# Patient Record
Sex: Female | Born: 1975
Health system: Southern US, Community
[De-identification: ages and names within clinical notes are randomized; demographics above are authoritative.]

## PROBLEM LIST (undated history)

## (undated) DIAGNOSIS — K649 Unspecified hemorrhoids: Secondary | ICD-10-CM

## (undated) HISTORY — PX: GALLBLADDER SURGERY: SHX652

## (undated) HISTORY — DX: Unspecified hemorrhoids: K64.9

## (undated) HISTORY — PX: DILATION AND CURETTAGE OF UTERUS: SHX78

## (undated) HISTORY — PX: HERNIA REPAIR: SHX51

## (undated) HISTORY — PX: APPENDECTOMY: SHX54

---

## 2006-03-04 HISTORY — PX: CYSTOSCOPY WITH URETHRAL DILATATION: SHX5125

## 2007-12-03 HISTORY — PX: UMBILICAL HERNIA REPAIR: SHX196

## 2011-11-02 ENCOUNTER — Other Ambulatory Visit: Payer: Self-pay

## 2011-11-02 LAB — CBC WITH DIFFERENTIAL/PLATELET
Basophil %: 0.7 %
Eosinophil %: 2.5 %
HCT: 40.9 % (ref 35.0–47.0)
HGB: 14.2 g/dL (ref 12.0–16.0)
Lymphocyte #: 1.5 10*3/uL (ref 1.0–3.6)
Lymphocyte %: 25.6 %
MCH: 32 pg (ref 26.0–34.0)
MCV: 92 fL (ref 80–100)
Monocyte #: 0.5 x10 3/mm (ref 0.2–0.9)
Neutrophil #: 3.7 10*3/uL (ref 1.4–6.5)
RBC: 4.44 10*6/uL (ref 3.80–5.20)
WBC: 5.9 10*3/uL (ref 3.6–11.0)

## 2011-11-02 LAB — LIPID PANEL
Cholesterol: 208 mg/dL — ABNORMAL HIGH (ref 0–200)
HDL Cholesterol: 92 mg/dL — ABNORMAL HIGH (ref 40–60)
Ldl Cholesterol, Calc: 106 mg/dL — ABNORMAL HIGH (ref 0–100)
Triglycerides: 48 mg/dL (ref 0–200)
VLDL Cholesterol, Calc: 10 mg/dL (ref 5–40)

## 2011-11-02 LAB — GLUCOSE, RANDOM: Glucose: 95 mg/dL (ref 65–99)

## 2014-08-03 ENCOUNTER — Telehealth: Payer: Self-pay

## 2014-08-03 ENCOUNTER — Telehealth: Payer: Self-pay | Admitting: Gastroenterology

## 2014-08-04 ENCOUNTER — Ambulatory Visit: Payer: Self-pay | Admitting: Gastroenterology

## 2014-08-04 ENCOUNTER — Encounter: Payer: Self-pay | Admitting: Gastroenterology

## 2014-08-04 ENCOUNTER — Ambulatory Visit (INDEPENDENT_AMBULATORY_CARE_PROVIDER_SITE_OTHER): Payer: 59 | Admitting: Gastroenterology

## 2014-08-04 VITALS — BP 120/81 | HR 56 | Temp 98.2°F | Ht 67.0 in | Wt 204.0 lb

## 2014-08-04 DIAGNOSIS — R1013 Epigastric pain: Secondary | ICD-10-CM | POA: Diagnosis not present

## 2014-08-04 DIAGNOSIS — K5900 Constipation, unspecified: Secondary | ICD-10-CM | POA: Diagnosis not present

## 2014-08-04 DIAGNOSIS — R140 Abdominal distension (gaseous): Secondary | ICD-10-CM | POA: Insufficient documentation

## 2014-08-04 NOTE — Progress Notes (Signed)
Primary Care Physician:  Enid Derry, MD  Primary Gastroenterologist:  Dr. Lucilla Lame  Chief Complaint  Patient presents with  . Abdominal Pain  . Constipation    HPI:  Sheryl Morrow is a 39 y.o. female here for abdominal pain in the epigastric area with constipation.  The patient reports that she had a colonoscopy 1 year ago in [place] for rectal bleeding which showed hemorrhoids.  She reports that shortly after eating she gets bloated she also reports that she is constipated.  Sometimes she will have 2 bowel movements a day and then would not have bowel movements for a few days.  There is no report of any unexplained weight loss in fact the patient reports that she has gained 15 pounds over the last year.  She was seen by her primary care provider who sent off blood tests for possible other causes including thyroid.  The patient reports this to have been negative.  The patient reports that she will have epigastric pain with bloating shortly after eating and states that it resulted in her not feeling well for a good part of the day after eating.  Patient denies any nausea vomiting fevers or chills. The patient has recently been start magnesium citrate and MiraLAX she has only taken it for one day.  Current Outpatient Prescriptions  Medication Sig Dispense Refill  . Omega-3 Fatty Acids (FISH OIL) 1000 MG CAPS Take by mouth.     No current facility-administered medications for this visit.    Allergies as of 08/04/2014  . (No Known Allergies)    Past Medical History  Diagnosis Date  . Hemorrhoids     Past Surgical History  Procedure Laterality Date  . Cystoscopy with urethral dilatation  2008  . Umbilical hernia repair  12/2007  . Cesarean section  09/2008    Family History  Problem Relation Age of Onset  . Heart disease Father     Bypass  . Diabetes Paternal Grandmother     History   Social History  . Marital Status: Married    Spouse Name: N/A  . Number of  Children: N/A  . Years of Education: N/A   Occupational History  . Not on file.   Social History Main Topics  . Smoking status: Never Smoker   . Smokeless tobacco: Not on file  . Alcohol Use: 0.0 oz/week    0 Standard drinks or equivalent per week     Comment: Moderate use  . Drug Use: No  . Sexual Activity: Not on file   Other Topics Concern  . Not on file   Social History Narrative      ROS:  General: Negative for anorexia, weight loss, fever, chills, fatigue, weakness. Eyes: Negative for vision changes.  ENT: Negative for hoarseness, difficulty swallowing , nasal congestion. CV: Negative for chest pain, angina, palpitations, dyspnea on exertion, peripheral edema.  Respiratory: Negative for dyspnea at rest, dyspnea on exertion, cough, sputum, wheezing.  GI: See history of present illness. GU:  Negative for dysuria, hematuria, urinary incontinence, urinary frequency, nocturnal urination.  MS: Negative for joint pain, low back pain.  Derm: Negative for rash or itching.  Neuro: Negative for weakness, abnormal sensation, seizure, frequent headaches, memory loss, confusion.  Psych: Negative for anxiety, depression, suicidal ideation, hallucinations.  Endo: Negative for unusual weight change.  Heme: Negative for bruising or bleeding. Allergy: Negative for rash or hives.    Physical Examination:  BP 120/81 mmHg  Pulse 56  Temp(Src) 98.2 F (  36.8 C) (Oral)  Ht 5' 7"  (1.702 m)  Wt 204 lb (92.534 kg)  BMI 31.94 kg/m2   General: Well-nourished, well-developed in no acute distress.  Head: Normocephalic, atraumatic.   Eyes: Conjunctiva pink, no icterus. EOMI. Mouth: Oropharyngeal mucosa moist and pink, no lesions erythema or exudate. Neck: Supple without thyromegaly, masses, or lymphadenopathy.  Lungs: Clear to auscultation bilaterally.  Heart: Regular rate and rhythm, no murmurs rubs or gallops.  Abdomen: Bowel sounds are normal, soft, nontender, nondistended, no  hepatosplenomegaly or masses, no abdominal bruits or hernia, no rebound tenderness or guarding.  Rectal: Deferred. Extremities: No lower extremity edema. No clubbing or deformities.  Neuro: Alert and oriented x 3, grossly normal neurologically.  Skin: Warm and dry, no rash or jaundice.   Psych: Cooperative, normal mood and affect.  Imaging Studies: No results found.

## 2014-08-04 NOTE — Assessment & Plan Note (Signed)
This patient is a 39 year old woman constipation and weight gain.  The patient has been having these problems for the last 4 months and will be set up for a right upper quadrant ultrasound due to dyspepsia when she eats.  She also has bloating.  The patient will also have her blood sent for H. pylori.  The patient does not need a repeat colonoscopy since she had one a year ago and I do not believe an upper endoscopy is needed at the present time.  The patient has been explained the plan and agrees with it.

## 2014-08-04 NOTE — Addendum Note (Signed)
Addended byGlennie Isle E on: 08/04/2014 12:25 PM   Modules accepted: Orders

## 2014-08-04 NOTE — Telephone Encounter (Signed)
Opened encounter in error  

## 2014-08-05 NOTE — Telephone Encounter (Signed)
Pt was seen by Dr Allen Norris on 08/04/14.

## 2014-08-06 LAB — H PYLORI, IGM, IGG, IGA AB: H. pylori, IgA Abs: <9 units (ref 0.0–8.9)

## 2014-08-08 NOTE — Progress Notes (Signed)
Noted, thank you for caring for patient

## 2014-08-17 ENCOUNTER — Telehealth: Payer: Self-pay

## 2014-08-17 NOTE — Telephone Encounter (Signed)
Pt notified of results. Pt stated she was feeling better. Will let us know if anything changes.

## 2014-08-17 NOTE — Telephone Encounter (Signed)
-----   Message from Lucilla Lame, MD sent at 08/05/2014  1:53 PM EDT ----- Sheryl Morrow, Let the patient know that the blood test for H. pylori was negative.

## 2014-09-07 ENCOUNTER — Telehealth: Payer: Self-pay

## 2014-09-07 NOTE — Telephone Encounter (Signed)
-----   Message from Lucilla Lame, MD sent at 08/05/2014 11:29 AM EDT ----- This patient never went for the U/S

## 2014-09-07 NOTE — Telephone Encounter (Signed)
Pt feeling better. Will call back if anything changes.

## 2015-07-11 DIAGNOSIS — D1801 Hemangioma of skin and subcutaneous tissue: Secondary | ICD-10-CM | POA: Diagnosis not present

## 2015-07-24 ENCOUNTER — Ambulatory Visit (INDEPENDENT_AMBULATORY_CARE_PROVIDER_SITE_OTHER): Payer: 59 | Admitting: Family Medicine

## 2015-07-24 ENCOUNTER — Encounter: Payer: Self-pay | Admitting: Family Medicine

## 2015-07-24 VITALS — BP 120/76 | HR 96 | Temp 97.8°F | Ht 67.0 in | Wt 207.4 lb

## 2015-07-24 DIAGNOSIS — E669 Obesity, unspecified: Secondary | ICD-10-CM | POA: Diagnosis not present

## 2015-07-24 DIAGNOSIS — Z1239 Encounter for other screening for malignant neoplasm of breast: Secondary | ICD-10-CM

## 2015-07-24 DIAGNOSIS — Z130 Encounter for screening for diseases of the blood and blood-forming organs and certain disorders involving the immune mechanism: Secondary | ICD-10-CM

## 2015-07-24 DIAGNOSIS — Z0000 Encounter for general adult medical examination without abnormal findings: Secondary | ICD-10-CM | POA: Insufficient documentation

## 2015-07-24 DIAGNOSIS — Z205 Contact with and (suspected) exposure to viral hepatitis: Secondary | ICD-10-CM

## 2015-07-24 LAB — CBC
HEMATOCRIT: 40 % (ref 36.0–46.0)
Hemoglobin: 13.6 g/dL (ref 12.0–15.0)
MCHC: 33.9 g/dL (ref 30.0–36.0)
MCV: 90.4 fl (ref 78.0–100.0)
PLATELETS: 203 10*3/uL (ref 150.0–400.0)
RBC: 4.42 Mil/uL (ref 3.87–5.11)
RDW: 12.9 % (ref 11.5–15.5)
WBC: 7.2 10*3/uL (ref 4.0–10.5)

## 2015-07-24 LAB — COMPREHENSIVE METABOLIC PANEL
ALBUMIN: 4.5 g/dL (ref 3.5–5.2)
ALK PHOS: 41 U/L (ref 39–117)
ALT: 20 U/L (ref 0–35)
AST: 26 U/L (ref 0–37)
BUN: 16 mg/dL (ref 6–23)
CALCIUM: 9.7 mg/dL (ref 8.4–10.5)
CO2: 30 mEq/L (ref 19–32)
Chloride: 100 mEq/L (ref 96–112)
Creatinine, Ser: 0.9 mg/dL (ref 0.40–1.20)
GFR: 73.38 mL/min (ref >60.00)
Glucose, Bld: 88 mg/dL (ref 70–99)
POTASSIUM: 4 meq/L (ref 3.5–5.1)
Sodium: 136 mEq/L (ref 135–145)
TOTAL PROTEIN: 7 g/dL (ref 6.0–8.3)
Total Bilirubin: 0.6 mg/dL (ref 0.2–1.2)

## 2015-07-24 LAB — LIPID PANEL
CHOL/HDL RATIO: 3
CHOLESTEROL: 230 mg/dL — AB (ref 0–200)
HDL: 75.4 mg/dL (ref >39.00)
LDL Cholesterol: 142 mg/dL — ABNORMAL HIGH (ref 0–99)
NonHDL: 154.54
Triglycerides: 64 mg/dL (ref 0.0–149.0)
VLDL: 12.8 mg/dL (ref 0.0–40.0)

## 2015-07-24 LAB — LDL CHOLESTEROL, DIRECT: LDL DIRECT: 127 mg/dL

## 2015-07-24 LAB — HEMOGLOBIN A1C: HEMOGLOBIN A1C: 5.5 % (ref 4.6–6.5)

## 2015-07-24 MED ORDER — CLONAZEPAM 0.5 MG PO TABS: 0.5000 mg | ORAL_TABLET | Freq: Two times a day (BID) | ORAL | Status: DC | PRN

## 2015-07-24 NOTE — Progress Notes (Signed)
Pre visit review using our clinic review tool, if applicable. No additional management support is needed unless otherwise documented below in the visit note. 

## 2015-07-24 NOTE — Progress Notes (Addendum)
Subjective:  Patient ID: Sheryl Morrow, female    DOB: 1976/02/09  Age: 40 y.o. MRN: [id number]  CC: Establish care  HPI Sheryl Morrow is a 40 y.o. female presents to the clinic today to establish care.  Preventative Healthcare  Pap smear: In need of (last pap smear was in May 2014).  Mammogram: Will discuss screening today.  Colonoscopy: Had colonoscopy in 2014.  Immunizations  Tetanus - up-to-date.  Pneumococcal - not indicated.  Flu - not indicated at this time.  Zoster - not indicated at this time.  Hepatitis C screening - patient's husband has hepatitis C. She desires screening.  Labs: Screening labs today.  Exercise: Reports regular exercise.   Alcohol use: See below.  Smoking/tobacco use: Former smoker.  Wears seat belt: Yes.  PMH, Surgical Hx, Family Hx, Social History reviewed and updated as below.  Past Medical History:  Diagnosis Date  . Hemorrhoids    Past Surgical History:  Procedure Laterality Date  . APPENDECTOMY    . BREAST SURGERY     breast biopsy  . CESAREAN SECTION  09/2008  . CYSTOSCOPY WITH URETHRAL DILATATION  2008  . DILATION AND CURETTAGE OF UTERUS     twice in 2007  . GALLBLADDER SURGERY     2007  . HERNIA REPAIR     embilical  . UMBILICAL HERNIA REPAIR  12/2007   Family History  Problem Relation Age of Onset  . Heart disease Father     Bypass  . Hypertension Father   . Diabetes Paternal Grandmother    Social History  Substance Use Topics  . Smoking status: Never Smoker  . Smokeless tobacco: Not on file  . Alcohol use 0.0 oz/week     Comment: Moderate use   Review of Systems General: Denies unexplained weight loss, fever. Skin: Denies new or changing mole, sore/wound that won't heal. ENT: Trouble hearing, ringing in the ears, sores in the mouth, hoarseness, trouble swallowing. Eyes: Denies trouble seeing/visual disturbance. Heart/CV: Denies chest pain, shortness of breath, edema, palpitations. Lungs/Resp:  Denies cough, shortness of breath, hemoptysis. Abd/GI: Denies nausea, vomiting, diarrhea, constipation, abdominal pain, hematochezia, melena. GU: Denies dysuria, incontinence, hematuria, urinary frequency, difficulty starting/keeping stream, vaginal discharge, sexual difficulty, lump in breasts. MSK: Denies joint pain/swelling, myalgias. Neuro: Denies headaches, weakness, numbness, dizziness, syncope. Psych: Denies sadness, anxiety, stress, memory difficulty. Endocrine: Denies polyuria and polydipsia.  Objective:   Today's Vitals: BP 120/76   Pulse 96   Temp 97.8 F (36.6 C) (Oral)   Ht _0  (1.702 m)   Wt 207 lb 6.4 oz (94.1 kg)   LMP 07/10/2015   SpO2 96%   BMI 32.48 kg/m   Physical Exam  Constitutional: She is oriented to person, place, and time. She appears well-developed and well-nourished. No distress.  HENT:  Head: Normocephalic and atraumatic.  Nose: Nose normal.  Mouth/Throat: Oropharynx is clear and moist. No oropharyngeal exudate.  Normal TM's bilaterally.   Eyes: Conjunctivae are normal. No scleral icterus.  Neck: Neck supple. No thyromegaly present.  Cardiovascular: Normal rate and regular rhythm.   No murmur heard. Pulmonary/Chest: Effort normal and breath sounds normal. She has no wheezes. She has no rales.  Abdominal: Soft. She exhibits no distension. There is no tenderness. There is no rebound and no guarding.  Musculoskeletal: Normal range of motion. She exhibits no edema.  Lymphadenopathy:    She has no cervical adenopathy.  Neurological: She is alert and oriented to person, place, and time.  Skin: Skin is  warm and dry. No rash noted.  Psychiatric: She has a normal mood and affect.  Vitals reviewed. Breasts: breasts appear normal, no appreciable masses, no skin or nipple changes or axillary nodes.  Assessment & Plan:   Problem List Items Addressed This Visit    Preventative health care - Primary    Pap smear later this year. Immunizations up to  date. Screening labs today. Breast exam performed. Mammogram scheduled. Follow up later this year for mammogram.       Other Visit Diagnoses    Screening for deficiency anemia       Relevant Orders   CBC (Completed)   Obesity (BMI 30.0-34.9)       Relevant Orders   Comp Met (CMET) (Completed)   Lipid Profile (Completed)   Direct LDL (Completed)   HgB A1c (Completed)   Exposure to hepatitis C       Relevant Orders   Hepatitis C Antibody (Completed)   Screening for breast cancer          Outpatient Encounter Prescriptions as of 07/24/2015  Medication Sig  . Omega-3 Fatty Acids (FISH OIL) 1000 MG CAPS Take by mouth.  . [DISCONTINUED] clonazePAM (KLONOPIN) 0.5 MG tablet Take 0.5 mg by mouth 2 (two) times daily as needed for anxiety.  . [DISCONTINUED] clonazePAM (KLONOPIN) 0.5 MG tablet Take 1 tablet (0.5 mg total) by mouth 2 (two) times daily as needed for anxiety.   No facility-administered encounter medications on file as of 07/24/2015.     Follow-up: Return in about 6 months (around 01/24/2016).  [place]

## 2015-07-24 NOTE — Assessment & Plan Note (Signed)
Pap smear later this year. Immunizations up to date. Screening labs today. Breast exam performed. Mammogram scheduled. Follow up later this year for mammogram.

## 2015-07-24 NOTE — Patient Instructions (Addendum)
It was nice to see you today.  Follow up later this year for your pap smear.  We will call with your lab results.  Regarding your husband, take a look at [facility] (have him seen by a physician).  Follow up:  Return in about 6 months (around 01/24/2016).  Take care  Dr. Lacinda Axon

## 2015-07-25 LAB — HEPATITIS C ANTIBODY: HCV AB: NEGATIVE

## 2015-08-04 ENCOUNTER — Ambulatory Visit: Payer: 59 | Attending: Family Medicine

## 2015-08-22 ENCOUNTER — Ambulatory Visit
Admission: RE | Admit: 2015-08-22 | Discharge: 2015-08-22 | Disposition: A | Discharge status: Home / Self Care | Payer: 59 | Source: Ambulatory Visit | Attending: Family Medicine | Admitting: Family Medicine

## 2015-08-22 ENCOUNTER — Other Ambulatory Visit: Payer: Self-pay | Admitting: Family Medicine

## 2015-08-22 DIAGNOSIS — Z1239 Encounter for other screening for malignant neoplasm of breast: Secondary | ICD-10-CM

## 2015-08-22 DIAGNOSIS — Z1231 Encounter for screening mammogram for malignant neoplasm of breast: Secondary | ICD-10-CM | POA: Diagnosis not present

## 2015-10-05 ENCOUNTER — Other Ambulatory Visit: Payer: Self-pay | Admitting: Family Medicine

## 2015-10-05 NOTE — Telephone Encounter (Signed)
Medication was refilled 07/2015. Patient last seen 07/24/15. Please advise?

## 2015-10-06 NOTE — Telephone Encounter (Signed)
Faxed

## 2015-12-11 ENCOUNTER — Other Ambulatory Visit: Payer: Self-pay | Admitting: Family Medicine

## 2015-12-12 NOTE — Telephone Encounter (Signed)
faxed

## 2015-12-12 NOTE — Telephone Encounter (Signed)
Refilled 10/2015. Pt last see on 07/24/15. Please advise?

## 2016-01-17 ENCOUNTER — Other Ambulatory Visit: Payer: Self-pay | Admitting: Family Medicine

## 2016-01-17 NOTE — Telephone Encounter (Signed)
Faxed

## 2016-01-17 NOTE — Telephone Encounter (Signed)
Refilled 12/12/15. Pt last seen 07/24/15. Please advise?

## 2016-01-29 ENCOUNTER — Ambulatory Visit: Payer: 59 | Admitting: Family Medicine

## 2016-02-08 ENCOUNTER — Ambulatory Visit (INDEPENDENT_AMBULATORY_CARE_PROVIDER_SITE_OTHER): Payer: 59 | Admitting: Family Medicine

## 2016-02-08 ENCOUNTER — Encounter: Payer: Self-pay | Admitting: Family Medicine

## 2016-02-08 ENCOUNTER — Other Ambulatory Visit (HOSPITAL_COMMUNITY)
Admission: RE | Admit: 2016-02-08 | Discharge: 2016-02-08 | Disposition: A | Discharge status: Home / Self Care | Payer: 59 | Source: Ambulatory Visit | Attending: Family Medicine | Admitting: Family Medicine

## 2016-02-08 VITALS — BP 117/77 | HR 95 | Temp 97.6°F | Resp 12 | Wt 201.5 lb

## 2016-02-08 DIAGNOSIS — Z124 Encounter for screening for malignant neoplasm of cervix: Secondary | ICD-10-CM

## 2016-02-08 DIAGNOSIS — Z01419 Encounter for gynecological examination (general) (routine) without abnormal findings: Secondary | ICD-10-CM | POA: Diagnosis present

## 2016-02-08 DIAGNOSIS — Z1151 Encounter for screening for human papillomavirus (HPV): Secondary | ICD-10-CM | POA: Insufficient documentation

## 2016-02-08 DIAGNOSIS — Z0000 Encounter for general adult medical examination without abnormal findings: Secondary | ICD-10-CM | POA: Insufficient documentation

## 2016-02-08 DIAGNOSIS — J029 Acute pharyngitis, unspecified: Secondary | ICD-10-CM | POA: Diagnosis not present

## 2016-02-08 NOTE — Progress Notes (Signed)
   Subjective:  Patient ID: Sheryl Morrow, female    DOB: 10-19-1975  Age: 40 y.o. MRN: [id number]  CC: Pap smear, sore throat  HPI:  40 year old female presents for Pap smear. She also has complaints of sore throat.  Patient's last past year was in 2014. She is in need of Pap smear today.  Additionally, patient has had a sore throat for the past 4 days. No associated fevers or chills. She reports left-sided neck pain as well. No known exacerbating or relieving factors. She states that she feels well otherwise. No other concerns at this time.  Social Hx   Social History   Social History  . Marital status: Married    Spouse name: N/A  . Number of children: N/A  . Years of education: N/A   Social History Main Topics  . Smoking status: Never Smoker  . Smokeless tobacco: None  . Alcohol use 0.0 oz/week     Comment: Moderate use  . Drug use: No  . Sexual activity: Not Asked   Other Topics Concern  . None   Social History Narrative  . None    Review of Systems  Constitutional: Negative.   HENT: Positive for sore throat.   Genitourinary: Negative.    Objective:  BP 117/77 (BP Location: Left Arm, Patient Position: Sitting, Cuff Size: Normal)   Pulse 95   Temp 97.6 F (36.4 C) (Oral)   Resp 12   Wt 201 lb 8 oz (91.4 kg)   SpO2 99%   BMI 31.56 kg/m   BP/Weight 02/08/2016 7/94/8016 07/07/3746  Systolic BP 270 786 754  Diastolic BP 77 76 81  Wt. (Lbs) 201.5 207.4 204  BMI 31.56 32.48 31.94   Physical Exam  Constitutional: She is oriented to person, place, and time. She appears well-developed. No distress.  HENT:  Head: Normocephalic and atraumatic.  Mild oropharyngeal erythema.  Neck: Neck supple.  Pulmonary/Chest: Effort normal.  Genitourinary:  Genitourinary Comments: Pelvic Exam: External: normal female genitalia without lesions or masses Vagina: normal without lesions or masses Cervix: normal without lesions or masses. Pap smear: performed     Lymphadenopathy:    She has no cervical adenopathy.  Neurological: She is alert and oriented to person, place, and time.  Psychiatric: She has a normal mood and affect.  Vitals reviewed.  Lab Results  Component Value Date   WBC 7.2 07/24/2015   HGB 13.6 07/24/2015   HCT 40.0 07/24/2015   PLT 203.0 07/24/2015   GLUCOSE 88 07/24/2015   CHOL 230 (H) 07/24/2015   TRIG 64.0 07/24/2015   HDL 75.40 07/24/2015   LDLDIRECT 127.0 07/24/2015   LDLCALC 142 (H) 07/24/2015   ALT 20 07/24/2015   AST 26 07/24/2015   NA 136 07/24/2015   K 4.0 07/24/2015   CL 100 07/24/2015   CREATININE 0.90 07/24/2015   BUN 16 07/24/2015   CO2 30 07/24/2015   HGBA1C 5.5 07/24/2015    Assessment & Plan:   Problem List Items Addressed This Visit    Viral pharyngitis    New problem (uncomplicated illness). Appears viral in origin. Advised supportive care. Over-the-counter medications as needed.      Screening for cervical cancer - Primary    Pap smear performed today.      Relevant Orders   Cytology - PAP     Follow-up: Annually  [place]

## 2016-02-08 NOTE — Assessment & Plan Note (Signed)
Pap smear performed today.

## 2016-02-08 NOTE — Assessment & Plan Note (Signed)
New problem (uncomplicated illness). Appears viral in origin. Advised supportive care. Over-the-counter medications as needed.

## 2016-02-08 NOTE — Patient Instructions (Addendum)
We will call with the results.   Follow up annually.  Take care  Dr. Lacinda Axon

## 2016-02-08 NOTE — Progress Notes (Signed)
Pre visit review using our clinic review tool, if applicable. No additional management support is needed unless otherwise documented below in the visit note. 

## 2016-02-14 LAB — CYTOLOGY - PAP
Diagnosis: NEGATIVE
HPV: NOT DETECTED

## 2016-03-20 ENCOUNTER — Other Ambulatory Visit: Payer: Self-pay | Admitting: Family Medicine

## 2016-03-22 NOTE — Telephone Encounter (Signed)
Refilled 01/17/16. Pt las seen 02/08/16. Please advise?

## 2016-03-22 NOTE — Telephone Encounter (Signed)
faxed

## 2016-04-17 ENCOUNTER — Other Ambulatory Visit: Payer: Self-pay | Admitting: Family Medicine

## 2016-04-17 NOTE — Telephone Encounter (Signed)
Refilled 03/22/16. Pt last seen 02/08/16. Please advise?

## 2016-04-18 NOTE — Telephone Encounter (Signed)
faxed

## 2016-05-07 ENCOUNTER — Telehealth: Payer: Self-pay | Admitting: Family

## 2016-05-07 NOTE — Telephone Encounter (Signed)
Pt wanted to switch from Dr Lacinda Axon to [place]. Pt wanted a female provider. Thank you!

## 2016-07-04 ENCOUNTER — Telehealth: Payer: Self-pay | Admitting: Family

## 2016-07-04 NOTE — Telephone Encounter (Signed)
Reason for call: bloated Symptoms: middle of stomach , after eating, feels better when standing worse when sitting, going out of town to [place] tomorrow ,some constipation, bowel movements dark in color, stomach making gurgling sounds, yesterday hurting bad in upper stomach ,  Didn't urinate as much yesterday Duration 5 days Medications: tried Pantoprazole  Last seen for this problem: 1 year ago, for bloatness Seen by: Dr Allen Norris Advised to go to urgent care to be evaluated for symptoms

## 2016-07-04 NOTE — Telephone Encounter (Signed)
Pt called c/o discomfort on top of stomach, feels bloated after eating. She also states that there are some gurgling sounds. She states that she has had this for about 5 days. Please advise, thank you!  Call pt @ [phone]

## 2016-07-18 ENCOUNTER — Other Ambulatory Visit: Payer: Self-pay | Admitting: Family Medicine

## 2016-07-24 ENCOUNTER — Encounter: Payer: 59 | Admitting: Family Medicine

## 2016-07-25 ENCOUNTER — Ambulatory Visit (INDEPENDENT_AMBULATORY_CARE_PROVIDER_SITE_OTHER): Payer: 59 | Admitting: Family

## 2016-07-25 ENCOUNTER — Encounter: Payer: Self-pay | Admitting: Family

## 2016-07-25 VITALS — BP 140/90 | HR 75 | Temp 97.7°F | Resp 18 | Ht 66.0 in | Wt 207.5 lb

## 2016-07-25 DIAGNOSIS — Z0000 Encounter for general adult medical examination without abnormal findings: Secondary | ICD-10-CM | POA: Diagnosis not present

## 2016-07-25 DIAGNOSIS — K219 Gastro-esophageal reflux disease without esophagitis: Secondary | ICD-10-CM

## 2016-07-25 DIAGNOSIS — F419 Anxiety disorder, unspecified: Secondary | ICD-10-CM | POA: Insufficient documentation

## 2016-07-25 DIAGNOSIS — K649 Unspecified hemorrhoids: Secondary | ICD-10-CM | POA: Insufficient documentation

## 2016-07-25 LAB — LIPID PANEL
CHOLESTEROL: 246 mg/dL — AB (ref 0–200)
HDL: 99.6 mg/dL (ref >39.00)
LDL Cholesterol: 137 mg/dL — ABNORMAL HIGH (ref 0–99)
NonHDL: 146.63
TRIGLYCERIDES: 48 mg/dL (ref 0.0–149.0)
Total CHOL/HDL Ratio: 2
VLDL: 9.6 mg/dL (ref 0.0–40.0)

## 2016-07-25 LAB — COMPREHENSIVE METABOLIC PANEL
ALK PHOS: 48 U/L (ref 39–117)
ALT: 21 U/L (ref 0–35)
AST: 28 U/L (ref 0–37)
Albumin: 4.7 g/dL (ref 3.5–5.2)
BILIRUBIN TOTAL: 0.7 mg/dL (ref 0.2–1.2)
BUN: 16 mg/dL (ref 6–23)
CALCIUM: 9.5 mg/dL (ref 8.4–10.5)
CO2: 29 mEq/L (ref 19–32)
Chloride: 98 mEq/L (ref 96–112)
Creatinine, Ser: 0.82 mg/dL (ref 0.40–1.20)
GFR: 81.3 mL/min (ref >60.00)
Glucose, Bld: 96 mg/dL (ref 70–99)
Potassium: 4.3 mEq/L (ref 3.5–5.1)
Sodium: 133 mEq/L — ABNORMAL LOW (ref 135–145)
TOTAL PROTEIN: 7.5 g/dL (ref 6.0–8.3)

## 2016-07-25 LAB — CBC WITH DIFFERENTIAL/PLATELET
BASOS ABS: 0.1 10*3/uL (ref 0.0–0.1)
Basophils Relative: 1.1 % (ref 0.0–3.0)
EOS PCT: 5.2 % — AB (ref 0.0–5.0)
Eosinophils Absolute: 0.3 10*3/uL (ref 0.0–0.7)
HEMATOCRIT: 40.5 % (ref 36.0–46.0)
Hemoglobin: 13.5 g/dL (ref 12.0–15.0)
LYMPHS PCT: 32.7 % (ref 12.0–46.0)
Lymphs Abs: 1.7 10*3/uL (ref 0.7–4.0)
MCHC: 33.4 g/dL (ref 30.0–36.0)
MCV: 93 fl (ref 78.0–100.0)
Monocytes Absolute: 0.5 10*3/uL (ref 0.1–1.0)
Monocytes Relative: 9.3 % (ref 3.0–12.0)
NEUTROS ABS: 2.7 10*3/uL (ref 1.4–7.7)
Neutrophils Relative %: 51.7 % (ref 43.0–77.0)
Platelets: 227 10*3/uL (ref 150.0–400.0)
RBC: 4.36 Mil/uL (ref 3.87–5.11)
RDW: 12.7 % (ref 11.5–15.5)
WBC: 5.2 10*3/uL (ref 4.0–10.5)

## 2016-07-25 LAB — VITAMIN D 25 HYDROXY (VIT D DEFICIENCY, FRACTURES): VITD: 27.62 ng/mL — ABNORMAL LOW (ref 30.00–100.00)

## 2016-07-25 LAB — HIV ANTIBODY (ROUTINE TESTING W REFLEX): HIV: NONREACTIVE

## 2016-07-25 LAB — HEPATITIS C ANTIBODY: HCV Ab: NEGATIVE

## 2016-07-25 LAB — HEMOGLOBIN A1C: HEMOGLOBIN A1C: 5.4 % (ref 4.6–6.5)

## 2016-07-25 MED ORDER — SERTRALINE HCL 50 MG PO TABS: 50.0000 mg | ORAL_TABLET | Freq: Every day | ORAL | 0 refills | Status: DC

## 2016-07-25 MED ORDER — HYDROCORTISONE ACETATE 25 MG RE SUPP: 25.0000 mg | Freq: Two times a day (BID) | RECTAL | 0 refills | Status: DC

## 2016-07-25 MED ORDER — HYDROCORTISONE 2.5 % RE CREA: 1.0000 "application " | TOPICAL_CREAM | Freq: Two times a day (BID) | RECTAL | 0 refills | Status: DC

## 2016-07-25 NOTE — Assessment & Plan Note (Signed)
Trial zoloft. Discussed risks of klonopin. F/u 6-8 weeks.

## 2016-07-25 NOTE — Patient Instructions (Addendum)
tdap at local pharmacy  Labs today  Follow up 6-8 weeks for anxiety, trial of zoloft. Take zoloft at bedtime  We placed a referral. Mammogram this year. I asked that you call one the below locations and schedule this when it is convenient for you.   If you have dense breasts, you may ask for 3D mammogram over the traditional 2D mammogram as new evidence suggest 3D is superior. Please note that NOT all insurance companies cover 3D and you may have to pay a higher copay. You may call your insurance company to further clarify your benefits.   Options for [place]  [place], [place]  * [facility] [place] * Note if you ask for 3D mammogram at this location, you must request [place], [place] location*   Long term use beyond 3 months of proton pump inhibitors , also called PPI's, is associated with malabsorption of vitamins, chronic kidney disease, fracture risk, and diarrheal illnesses. PPI's include Nexium, Prilosec, Protonix, Dexilant, and Prevacid.   I generally recommend trying to control acid reflux with lifestyle modifications including avoiding trigger foods, not eating 2 hours prior to bedtime. You may use histamine 2 blockers daily to twice daily ( this is Zantac, Pepcid) and then when symptoms flare, start back on PPI for short course.   Health Maintenance, Female Adopting a healthy lifestyle and getting preventive care can go a long way to promote health and wellness. Talk with your health care provider about what schedule of regular examinations is right for you. This is a good chance for you to check in with your provider about disease prevention and staying healthy. In between checkups, there are plenty of things you can do on your own. Experts have done a lot of research about which lifestyle changes and preventive  measures are most likely to keep you healthy. Ask your health care provider for more information. Weight and diet Eat a healthy diet  Be sure to include plenty of vegetables, fruits, low-fat dairy products, and lean protein.  Do not eat a lot of foods high in solid fats, added sugars, or salt.  Get regular exercise. This is one of the most important things you can do for your health.  Most adults should exercise for at least 150 minutes each week. The exercise should increase your heart rate and make you sweat (moderate-intensity exercise).  Most adults should also do strengthening exercises at least twice a week. This is in addition to the moderate-intensity exercise. Maintain a healthy weight  Body mass index (BMI) is a measurement that can be used to identify possible weight problems. It estimates body fat based on height and weight. Your health care provider can help determine your BMI and help you achieve or maintain a healthy weight.  For females 22 years of age and older:  A BMI below 18.5 is considered underweight.  A BMI of 18.5 to 24.9 is normal.  A BMI of 25 to 29.9 is considered overweight.  A BMI of 30 and above is considered obese. Watch levels of cholesterol and blood lipids  You should start having your blood tested for lipids and cholesterol at 42 years of age, then have this test every 5 years.  You may need to have your cholesterol levels checked more often if:  Your lipid or cholesterol levels are high.  You are older than 42 years of age.  You are at high risk for heart disease. Cancer screening Lung Cancer  Lung cancer screening is recommended for adults 42-60 years old who are at high risk for lung cancer because of a history of smoking.  A yearly low-dose CT scan of the lungs is recommended for people who:  Currently smoke.  Have quit within the past 15 years.  Have at least a 30-pack-year history of smoking. A pack year is smoking an average of  one pack of cigarettes a day for 1 year.  Yearly screening should continue until it has been 15 years since you quit.  Yearly screening should stop if you develop a health problem that would prevent you from having lung cancer treatment. Breast Cancer  Practice breast self-awareness. This means understanding how your breasts normally appear and feel.  It also means doing regular breast self-exams. Let your health care provider know about any changes, no matter how small.  If you are in your 20s or 30s, you should have a clinical breast exam (CBE) by a health care provider every 1-3 years as part of a regular health exam.  If you are 84 or older, have a CBE every year. Also consider having a breast X-ray (mammogram) every year.  If you have a family history of breast cancer, talk to your health care provider about genetic screening.  If you are at high risk for breast cancer, talk to your health care provider about having an MRI and a mammogram every year.  Breast cancer gene (BRCA) assessment is recommended for women who have family members with BRCA-related cancers. BRCA-related cancers include:  Breast.  Ovarian.  Tubal.  Peritoneal cancers.  Results of the assessment will determine the need for genetic counseling and BRCA1 and BRCA2 testing. Cervical Cancer  Your health care provider may recommend that you be screened regularly for cancer of the pelvic organs (ovaries, uterus, and vagina). This screening involves a pelvic examination, including checking for microscopic changes to the surface of your cervix (Pap test). You may be encouraged to have this screening done every 3 years, beginning at age 4.  For women ages 42-65, health care providers may recommend pelvic exams and Pap testing every 3 years, or they may recommend the Pap and pelvic exam, combined with testing for human papilloma virus (HPV), every 5 years. Some types of HPV increase your risk of cervical cancer.  Testing for HPV may also be done on women of any age with unclear Pap test results.  Other health care providers may not recommend any screening for nonpregnant women who are considered low risk for pelvic cancer and who do not have symptoms. Ask your health care provider if a screening pelvic exam is right for you.  If you have had past treatment for cervical cancer or a condition that could lead to cancer, you need Pap tests and screening for cancer for at least 20 years after your treatment. If Pap tests have been discontinued, your risk factors (such as having a new sexual partner) need to be reassessed to determine if screening should resume. Some women have medical problems that increase the chance of getting cervical cancer. In these cases, your health care provider may recommend more frequent screening and Pap tests. Colorectal Cancer  This type of cancer can be detected and often prevented.  Routine colorectal cancer screening usually begins at 42 years of age and continues through 42 years of age.  Your  health care provider may recommend screening at an earlier age if you have risk factors for colon cancer.  Your health care provider may also recommend using home test kits to check for hidden blood in the stool.  A small camera at the end of a tube can be used to examine your colon directly (sigmoidoscopy or colonoscopy). This is done to check for the earliest forms of colorectal cancer.  Routine screening usually begins at age 49.  Direct examination of the colon should be repeated every 5-10 years through 42 years of age. However, you may need to be screened more often if early forms of precancerous polyps or small growths are found. Skin Cancer  Check your skin from head to toe regularly.  Tell your health care provider about any new moles or changes in moles, especially if there is a change in a mole's shape or color.  Also tell your health care provider if you have a mole  that is larger than the size of a pencil eraser.  Always use sunscreen. Apply sunscreen liberally and repeatedly throughout the day.  Protect yourself by wearing long sleeves, pants, a wide-brimmed hat, and sunglasses whenever you are outside. Heart disease, diabetes, and high blood pressure  High blood pressure causes heart disease and increases the risk of stroke. High blood pressure is more likely to develop in:  People who have blood pressure in the high end of the normal range (130-139/85-89 mm Hg).  People who are overweight or obese.  People who are African American.  If you are 25-24 years of age, have your blood pressure checked every 3-5 years. If you are 81 years of age or older, have your blood pressure checked every year. You should have your blood pressure measured twice-once when you are at a hospital or clinic, and once when you are not at a hospital or clinic. Record the average of the two measurements. To check your blood pressure when you are not at a hospital or clinic, you can use:  An automated blood pressure machine at a pharmacy.  A home blood pressure monitor.  If you are between 29 years and 50 years old, ask your health care provider if you should take aspirin to prevent strokes.  Have regular diabetes screenings. This involves taking a blood sample to check your fasting blood sugar level.  If you are at a normal weight and have a low risk for diabetes, have this test once every three years after 42 years of age.  If you are overweight and have a high risk for diabetes, consider being tested at a younger age or more often. Preventing infection Hepatitis B  If you have a higher risk for hepatitis B, you should be screened for this virus. You are considered at high risk for hepatitis B if:  You were born in a country where hepatitis B is common. Ask your health care provider which countries are considered high risk.  Your parents were born in a high-risk  country, and you have not been immunized against hepatitis B (hepatitis B vaccine).  You have HIV or AIDS.  You use needles to inject street drugs.  You live with someone who has hepatitis B.  You have had sex with someone who has hepatitis B.  You get hemodialysis treatment.  You take certain medicines for conditions, including cancer, organ transplantation, and autoimmune conditions. Hepatitis C  Blood testing is recommended for:  Everyone born from [birth year] through 1966.  Anyone with  known risk factors for hepatitis C. Sexually transmitted infections (STIs)  You should be screened for sexually transmitted infections (STIs) including gonorrhea and chlamydia if:  You are sexually active and are younger than 42 years of age.  You are older than 42 years of age and your health care provider tells you that you are at risk for this type of infection.  Your sexual activity has changed since you were last screened and you are at an increased risk for chlamydia or gonorrhea. Ask your health care provider if you are at risk.  If you do not have HIV, but are at risk, it may be recommended that you take a prescription medicine daily to prevent HIV infection. This is called pre-exposure prophylaxis (PrEP). You are considered at risk if:  You are sexually active and do not regularly use condoms or know the HIV status of your partner(s).  You take drugs by injection.  You are sexually active with a partner who has HIV. Talk with your health care provider about whether you are at high risk of being infected with HIV. If you choose to begin PrEP, you should first be tested for HIV. You should then be tested every 3 months for as long as you are taking PrEP. Pregnancy  If you are premenopausal and you may become pregnant, ask your health care provider about preconception counseling.  If you may become pregnant, take 400 to 800 micrograms (mcg) of folic acid every day.  If you want to prevent  pregnancy, talk to your health care provider about birth control (contraception). Osteoporosis and menopause  Osteoporosis is a disease in which the bones lose minerals and strength with aging. This can result in serious bone fractures. Your risk for osteoporosis can be identified using a bone density scan.  If you are 41 years of age or older, or if you are at risk for osteoporosis and fractures, ask your health care provider if you should be screened.  Ask your health care provider whether you should take a calcium or vitamin D supplement to lower your risk for osteoporosis.  Menopause may have certain physical symptoms and risks.  Hormone replacement therapy may reduce some of these symptoms and risks. Talk to your health care provider about whether hormone replacement therapy is right for you. Follow these instructions at home:  Schedule regular health, dental, and eye exams.  Stay current with your immunizations.  Do not use any tobacco products including cigarettes, chewing tobacco, or electronic cigarettes.  If you are pregnant, do not drink alcohol.  If you are breastfeeding, limit how much and how often you drink alcohol.  Limit alcohol intake to no more than 1 drink per day for nonpregnant women. One drink equals 12 ounces of beer, 5 ounces of wine, or 1 ounces of hard liquor.  Do not use street drugs.  Do not share needles.  Ask your health care provider for help if you need support or information about quitting drugs.  Tell your health care provider if you often feel depressed.  Tell your health care provider if you have ever been abused or do not feel safe at home. This information is not intended to replace advice given to you by your health care provider. Make sure you discuss any questions you have with your health care provider. Document Released: 09/03/2010 Document Revised: 07/27/2015 Document Reviewed: 11/22/2014 Elsevier Interactive Patient Education  2017  [employer].

## 2016-07-25 NOTE — Progress Notes (Signed)
Subjective:    Patient ID: Sheryl Morrow, female    DOB: 11/18/1975, 41 y.o.   MRN: [id number]  CC: Sheryl Morrow is a 41 y.o. female who presents today for physical exam.    HPI: Anxiety- prn klonopin. Uses at night to sleep. Uses half tablet. 3x per week. [place]   GERD- on prilosec QOD with relief. No difficultly swallowing.   Has had hemorrhoids since the birth of her child for several years. Waxes and wanes. It times and noticed blood apply paper. Severe internal and external is been trying over-the-counter topical medications without relief.      Colorectal Cancer Screening: No early family history. Recently had a friend who was diagnosed colon cancer in her 108s. She is quite concerned with colon cancer would like the option of colonoscopy. She will look into this with her insurance Breast Cancer Screening: Mammogram due Cervical Cancer Screening: UTD 2017, negative malignancy, hpv Bone Health screening/DEXA for 65+: No increased fracture risk. Defer screening at this time. Lung Cancer Screening: Doesn't have 30 year pack year history and age > 75 years. Immunizations       Tetanus - due         HIV Screening- Candidate for , consents Hep C - husband has and would like to to be screened Labs: Screening labs today. Exercise: Gets regular exercise.  Alcohol use: moderate Smoking/tobacco use: Nonsmoker.  Regular dental exams: UTD Wears seat belt: Yes. Skin: no new skin lesions; no dermatology  HISTORY:  Past Medical History:  Diagnosis Date  . Hemorrhoids     Past Surgical History:  Procedure Laterality Date  . APPENDECTOMY    . BREAST SURGERY     breast biopsy  . CESAREAN SECTION  09/2008  . CYSTOSCOPY WITH URETHRAL DILATATION  2008  . DILATION AND CURETTAGE OF UTERUS     twice in 2007  . GALLBLADDER SURGERY     2007  . HERNIA REPAIR     embilical  . UMBILICAL HERNIA REPAIR  12/2007   Family History  Problem Relation  Age of Onset  . Heart disease Father        Bypass  . Hypertension Father   . Diabetes Paternal Grandmother       ALLERGIES: Patient has no known allergies.  Current Outpatient Prescriptions on File Prior to Visit  Medication Sig Dispense Refill  . Omega-3 Fatty Acids (FISH OIL) 1000 MG CAPS Take by mouth.     No current facility-administered medications on file prior to visit.     Social History  Substance Use Topics  . Smoking status: Former Smoker    Types: Cigarettes  . Smokeless tobacco: Never Used  . Alcohol use 0.0 oz/week     Comment: Moderate use    Review of Systems  Constitutional: Negative for chills, fever and unexpected weight change.  HENT: Negative for congestion.   Respiratory: Negative for cough.   Cardiovascular: Negative for chest pain, palpitations and leg swelling.  Gastrointestinal: Negative for nausea and vomiting.  Musculoskeletal: Negative for arthralgias and myalgias.  Skin: Negative for rash.  Neurological: Negative for headaches.  Hematological: Negative for adenopathy.  Psychiatric/Behavioral: Positive for sleep disturbance. Negative for confusion. The patient is nervous/anxious.       Objective:    BP 140/90   Pulse 75   Temp 97.7 F (36.5 C) (Oral)   Resp 18   Ht 5' 6"  (1.676 m)   Wt 207 lb 8 oz (  94.1 kg)   LMP 07/20/2016   SpO2 99%   BMI 33.49 kg/m   BP Readings from Last 3 Encounters:  07/25/16 140/90  02/08/16 117/77  07/24/15 120/76   Wt Readings from Last 3 Encounters:  07/25/16 207 lb 8 oz (94.1 kg)  02/08/16 201 lb 8 oz (91.4 kg)  07/24/15 207 lb 6.4 oz (94.1 kg)    Physical Exam  Constitutional: She appears well-developed and well-nourished.  Eyes: Conjunctivae are normal.  Neck: No thyroid mass and no thyromegaly present.  Cardiovascular: Normal rate, regular rhythm, normal heart sounds and normal pulses.   Pulmonary/Chest: Effort normal and breath sounds normal. She has no wheezes. She has no rhonchi. She  has no rales. Right breast exhibits no inverted nipple, no mass, no nipple discharge, no skin change and no tenderness. Left breast exhibits no inverted nipple, no mass, no nipple discharge, no skin change and no tenderness. Breasts are symmetrical.  CBE performed.   Lymphadenopathy:       Head (right side): No submental, no submandibular, no tonsillar, no preauricular, no posterior auricular and no occipital adenopathy present.       Head (left side): No submental, no submandibular, no tonsillar, no preauricular, no posterior auricular and no occipital adenopathy present.    She has no cervical adenopathy.       Right cervical: No superficial cervical, no deep cervical and no posterior cervical adenopathy present.      Left cervical: No superficial cervical, no deep cervical and no posterior cervical adenopathy present.    She has no axillary adenopathy.  Neurological: She is alert.  Skin: Skin is warm and dry.  Psychiatric: She has a normal mood and affect. Her speech is normal and behavior is normal. Thought content normal.  Vitals reviewed.      Assessment & Plan:   Problem List Items Addressed This Visit      Cardiovascular and Mediastinum   Hemorrhoids    Empirically treat. Will let me know if not better      Relevant Medications   hydrocortisone (ANUSOL-HC) 2.5 % rectal cream   hydrocortisone (ANUSOL-HC) 25 MG suppository   Other Relevant Orders   Ambulatory referral to Gastroenterology     Digestive   GERD (gastroesophageal reflux disease)    Stable. Controlled. Education provided on long-term use of PPIs.      Relevant Medications   omeprazole (PRILOSEC) 40 MG capsule     Other   Routine physical examination - Primary    Colonoscopy ordered .She is having bleeding symptoms however we both suspect from chronic hemorrhoids. Will treat empirically  however referral paced colonoscopy due to patient's concern. Mammogram ordered; patient understands schedule.Advised 3d.   UTD pap. Advised tetanus vaccine at a local pharmacy. HIV, hepatitis C screening included a basic lab work and patient consented for. Advised regular dermatologist for annual skin check and referral placed. Encouraged regular exercise      Relevant Orders   CBC with Differential/Platelet   Comprehensive metabolic panel   Hemoglobin A1c   Lipid panel   VITAMIN D 25 Hydroxy (Vit-D Deficiency, Fractures)   MM SCREENING BREAST TOMO BILATERAL   Ambulatory referral to Gastroenterology   Hepatitis C antibody   HIV antibody   Ambulatory referral to Dermatology   Anxiety    Trial zoloft. Discussed risks of klonopin. F/u 6-8 weeks.      Relevant Medications   sertraline (ZOLOFT) 50 MG tablet       I have discontinued  Ms. Peralta's clonazePAM. I am also having her start on hydrocortisone, hydrocortisone, and sertraline. Additionally, I am having her maintain her Fish Oil and omeprazole.   Meds ordered this encounter  Medications  . omeprazole (PRILOSEC) 40 MG capsule  . hydrocortisone (ANUSOL-HC) 2.5 % rectal cream    Sig: Place 1 application rectally 2 (two) times daily.    Dispense:  30 g    Refill:  0    Order Specific Question:   Supervising Provider    Answer:   Deborra Medina L [[id number]]  . hydrocortisone (ANUSOL-HC) 25 MG suppository    Sig: Place 1 suppository (25 mg total) rectally 2 (two) times daily.    Dispense:  12 suppository    Refill:  0    Order Specific Question:   Supervising Provider    Answer:   Derrel Nip, TERESA L [[id number]]  . sertraline (ZOLOFT) 50 MG tablet    Sig: Take 1 tablet (50 mg total) by mouth at bedtime.    Dispense:  90 tablet    Refill:  0    Order Specific Question:   Supervising Provider    Answer:   Crecencio Mc [[id number]]    Return precautions given.   Risks, benefits, and alternatives of the medications and treatment plan prescribed today were discussed, and patient expressed understanding.   Education regarding symptom management and diagnosis  given to patient on AVS.   Continue to follow with Burnard Hawthorne, FNP for routine health maintenance.   Armstead Peaks and I agreed with plan.   Mable Paris, FNP  Total of 15 minutes spent with patient today, greater than 50% of which was spent in discussion of  Anxiety, risks of BZDs.

## 2016-07-25 NOTE — Assessment & Plan Note (Addendum)
Empirically treat. Will let me know if not better

## 2016-07-25 NOTE — Assessment & Plan Note (Signed)
Stable. Controlled. Education provided on long-term use of PPIs.

## 2016-07-25 NOTE — Assessment & Plan Note (Addendum)
Colonoscopy ordered .She is having bleeding symptoms however we both suspect from chronic hemorrhoids. Will treat empirically  however referral paced colonoscopy due to patient's concern. Mammogram ordered; patient understands schedule.Advised 3d.  UTD pap. Advised tetanus vaccine at a local pharmacy. HIV, hepatitis C screening included a basic lab work and patient consented for. Advised regular dermatologist for annual skin check and referral placed. Encouraged regular exercise

## 2016-08-01 ENCOUNTER — Telehealth: Payer: Self-pay | Admitting: Family

## 2016-08-01 NOTE — Telephone Encounter (Signed)
Pt called to follow up on her lab results. I advised that her results was mailed out. Pt also has a question regarding the tetanus shot. Please advise?  Call pt @ [phone]. Thank you!

## 2016-08-01 NOTE — Telephone Encounter (Signed)
Patient has been notified

## 2016-08-01 NOTE — Telephone Encounter (Signed)
Left message for patient to return call back.  

## 2016-08-26 ENCOUNTER — Other Ambulatory Visit: Payer: Self-pay | Admitting: Family

## 2016-08-26 ENCOUNTER — Ambulatory Visit
Admission: RE | Admit: 2016-08-26 | Discharge: 2016-08-26 | Disposition: A | Discharge status: Home / Self Care | Payer: 59 | Source: Ambulatory Visit | Attending: Family | Admitting: Family

## 2016-08-26 DIAGNOSIS — R928 Other abnormal and inconclusive findings on diagnostic imaging of breast: Secondary | ICD-10-CM | POA: Insufficient documentation

## 2016-08-26 DIAGNOSIS — N6489 Other specified disorders of breast: Secondary | ICD-10-CM

## 2016-08-26 DIAGNOSIS — Z1231 Encounter for screening mammogram for malignant neoplasm of breast: Secondary | ICD-10-CM | POA: Diagnosis present

## 2016-08-26 DIAGNOSIS — Z0000 Encounter for general adult medical examination without abnormal findings: Secondary | ICD-10-CM

## 2016-08-26 DIAGNOSIS — N6310 Unspecified lump in the right breast, unspecified quadrant: Secondary | ICD-10-CM

## 2016-08-28 ENCOUNTER — Ambulatory Visit
Admission: RE | Admit: 2016-08-28 | Discharge: 2016-08-28 | Disposition: A | Discharge status: Home / Self Care | Payer: 59 | Source: Ambulatory Visit | Attending: Family | Admitting: Family

## 2016-08-28 DIAGNOSIS — N6310 Unspecified lump in the right breast, unspecified quadrant: Secondary | ICD-10-CM | POA: Diagnosis not present

## 2016-08-28 DIAGNOSIS — R928 Other abnormal and inconclusive findings on diagnostic imaging of breast: Secondary | ICD-10-CM | POA: Diagnosis present

## 2016-08-28 DIAGNOSIS — N6489 Other specified disorders of breast: Secondary | ICD-10-CM | POA: Diagnosis not present

## 2016-09-24 ENCOUNTER — Ambulatory Visit: Payer: 59 | Admitting: Family

## 2016-10-20 ENCOUNTER — Other Ambulatory Visit: Payer: Self-pay | Admitting: Family Medicine

## 2016-10-21 ENCOUNTER — Other Ambulatory Visit: Payer: Self-pay | Admitting: Family

## 2016-10-21 NOTE — Telephone Encounter (Signed)
  Please call and clarify with patient.   Appears Sheryl Morrow gave her a rx which was 60 tabs filled in 09/2016 with 2 refills.  Not sure how she could be out?   Note to myself:  I looked up patient on [place] Controlled Substances Reporting System and saw no activity that raised concern of inappropriate use once we have clarified this refill.

## 2016-10-21 NOTE — Telephone Encounter (Addendum)
Last office visit 07/25/16 Last office visit cancelled 09/24/16, to follow up 6-8 weeks

## 2016-10-22 NOTE — Telephone Encounter (Signed)
Spoke with patient she states she didn't start Zoloft because she read about side effect and didn't open package. She states she is not taking clonazepam more than tid 1 q hs .   She wasn't able to script due to refill expired before getting filled 10/15/16.  Does she need to come in for office visit.

## 2016-10-22 NOTE — Telephone Encounter (Signed)
Call and clarify with patient-   She received 60 tabs in July with 2 refills from Dr Lacinda Axon- or am I confused here?   She told me at CPE that she uses approx 3x per week at night for sleep.   If she is using more than that or out of medication, please let me know. She would need OV or we should increase her zoloft if anxiety is not well controlled.    I looked up patient on [place] Controlled Substances Reporting System and saw no activity that raised concern of inappropriate use.

## 2016-10-25 NOTE — Telephone Encounter (Signed)
Pt stated that she was needing this refilled  Pt contact [phone]

## 2016-10-25 NOTE — Telephone Encounter (Signed)
Patient is not currently out of medication but she was wanting it as a refill.  She wants to put it on standby at pharmacy.  She is using the medication a couple times a week.

## 2016-10-25 NOTE — Telephone Encounter (Signed)
Sheryl Morrow,  Call pt.  Please get more info.  See my note below. She picked up rx July with 2 refills  How often is pt using klonopin?  Is she OUT today or can I refill on Monday?

## 2016-10-28 ENCOUNTER — Telehealth: Payer: Self-pay | Admitting: Family

## 2016-10-28 NOTE — Telephone Encounter (Signed)
Patient called back given information will call when refill is need.

## 2016-10-28 NOTE — Telephone Encounter (Signed)
Call pt  Inform her that if she is not out, I cannot refill.   It is a controlled substance.   She may contact us when she is close to being out.

## 2016-10-28 NOTE — Telephone Encounter (Signed)
Left message to return call to our office.  

## 2016-10-29 ENCOUNTER — Telehealth: Payer: Self-pay

## 2016-10-29 NOTE — Telephone Encounter (Signed)
close

## 2016-10-29 NOTE — Telephone Encounter (Signed)
Called Patient went over all information she will try the OTC meds and call if any problems.

## 2016-10-29 NOTE — Telephone Encounter (Signed)
Patient called states she was given by walk in for reflux. She needs refill. You have never given. Ok to refill?

## 2016-10-29 NOTE — Telephone Encounter (Signed)
I recommend zantac OTC BID one hour before meal  She doesn't need rx unless cheaper for her.  If she prefers prilosec , okay to refill as long as she knows the following- there are long terms risks associated. May refill and then we can discuss reflux at follow up  See below.    Long term use beyond 3 months of proton pump inhibitors , also called PPI's, is associated with malabsorption of vitamins, chronic kidney disease, fracture risk, and diarrheal illnesses. PPI's include Nexium, Prilosec, Protonix, Dexilant, and Prevacid.   I generally recommend trying to control acid reflux with lifestyle modifications including avoiding trigger foods, not eating 2 hours prior to bedtime. You may use histamine 2 blockers daily to twice daily ( this is Zantac, Pepcid) and then when symptoms flare, start back on PPI for short course.

## 2016-11-28 ENCOUNTER — Other Ambulatory Visit: Payer: Self-pay | Admitting: Family Medicine

## 2016-11-28 NOTE — Telephone Encounter (Signed)
Please advise for refill?

## 2016-11-29 NOTE — Telephone Encounter (Signed)
Script faxed to [facility]

## 2017-01-31 ENCOUNTER — Other Ambulatory Visit: Payer: Self-pay | Admitting: Family Medicine

## 2017-04-15 ENCOUNTER — Other Ambulatory Visit: Payer: Self-pay | Admitting: Family Medicine

## 2017-04-15 DIAGNOSIS — F419 Anxiety disorder, unspecified: Secondary | ICD-10-CM

## 2017-04-18 NOTE — Telephone Encounter (Signed)
Okay to refill? 

## 2017-04-18 NOTE — Telephone Encounter (Signed)
Call pt Refilled klonopin  Ensure she comes in to be seen in the next few months to maintain rx for controlled substance, should be seen every 6 months.   I looked up patient on [place] Controlled Substances Reporting System and saw no activity that raised concern of inappropriate use.

## 2017-04-21 NOTE — Telephone Encounter (Signed)
Spoke with patient appointment scheduled

## 2017-05-19 ENCOUNTER — Ambulatory Visit: Payer: 59 | Admitting: Family

## 2017-07-30 ENCOUNTER — Other Ambulatory Visit: Payer: Self-pay | Admitting: Family

## 2017-07-30 DIAGNOSIS — Z1231 Encounter for screening mammogram for malignant neoplasm of breast: Secondary | ICD-10-CM

## 2017-08-06 ENCOUNTER — Encounter: Payer: Self-pay | Admitting: Family

## 2017-08-06 ENCOUNTER — Ambulatory Visit (INDEPENDENT_AMBULATORY_CARE_PROVIDER_SITE_OTHER): Payer: BLUE CROSS/BLUE SHIELD | Admitting: Family

## 2017-08-06 VITALS — BP 126/78 | HR 67 | Temp 98.1°F | Resp 16 | Ht 66.0 in | Wt 213.5 lb

## 2017-08-06 DIAGNOSIS — E7800 Pure hypercholesterolemia, unspecified: Secondary | ICD-10-CM | POA: Diagnosis not present

## 2017-08-06 DIAGNOSIS — E559 Vitamin D deficiency, unspecified: Secondary | ICD-10-CM

## 2017-08-06 DIAGNOSIS — R635 Abnormal weight gain: Secondary | ICD-10-CM

## 2017-08-06 DIAGNOSIS — F419 Anxiety disorder, unspecified: Secondary | ICD-10-CM | POA: Diagnosis not present

## 2017-08-06 DIAGNOSIS — Z0000 Encounter for general adult medical examination without abnormal findings: Secondary | ICD-10-CM

## 2017-08-06 DIAGNOSIS — Z205 Contact with and (suspected) exposure to viral hepatitis: Secondary | ICD-10-CM | POA: Diagnosis not present

## 2017-08-06 DIAGNOSIS — K649 Unspecified hemorrhoids: Secondary | ICD-10-CM

## 2017-08-06 DIAGNOSIS — R7989 Other specified abnormal findings of blood chemistry: Secondary | ICD-10-CM

## 2017-08-06 DIAGNOSIS — K625 Hemorrhage of anus and rectum: Secondary | ICD-10-CM | POA: Diagnosis not present

## 2017-08-06 DIAGNOSIS — Z23 Encounter for immunization: Secondary | ICD-10-CM | POA: Diagnosis not present

## 2017-08-06 LAB — CBC WITH DIFFERENTIAL/PLATELET
BASOS ABS: 0.1 10*3/uL (ref 0.0–0.1)
BASOS PCT: 1.3 % (ref 0.0–3.0)
EOS ABS: 0.3 10*3/uL (ref 0.0–0.7)
Eosinophils Relative: 6.4 % — ABNORMAL HIGH (ref 0.0–5.0)
HEMATOCRIT: 38.2 % (ref 36.0–46.0)
Hemoglobin: 13 g/dL (ref 12.0–15.0)
LYMPHS ABS: 1.7 10*3/uL (ref 0.7–4.0)
LYMPHS PCT: 32 % (ref 12.0–46.0)
MCHC: 34.1 g/dL (ref 30.0–36.0)
MCV: 94.1 fl (ref 78.0–100.0)
Monocytes Absolute: 0.5 10*3/uL (ref 0.1–1.0)
Monocytes Relative: 9.9 % (ref 3.0–12.0)
NEUTROS ABS: 2.7 10*3/uL (ref 1.4–7.7)
NEUTROS PCT: 50.4 % (ref 43.0–77.0)
PLATELETS: 198 10*3/uL (ref 150.0–400.0)
RBC: 4.06 Mil/uL (ref 3.87–5.11)
RDW: 13.3 % (ref 11.5–15.5)
WBC: 5.3 10*3/uL (ref 4.0–10.5)

## 2017-08-06 LAB — LIPID PANEL
CHOL/HDL RATIO: 3
Cholesterol: 232 mg/dL — ABNORMAL HIGH (ref 0–200)
HDL: 90.2 mg/dL (ref >39.00)
LDL Cholesterol: 133 mg/dL — ABNORMAL HIGH (ref 0–99)
NONHDL: 141.32
TRIGLYCERIDES: 44 mg/dL (ref 0.0–149.0)
VLDL: 8.8 mg/dL (ref 0.0–40.0)

## 2017-08-06 LAB — COMPREHENSIVE METABOLIC PANEL
ALT: 18 U/L (ref 0–35)
AST: 22 U/L (ref 0–37)
Albumin: 4.3 g/dL (ref 3.5–5.2)
Alkaline Phosphatase: 45 U/L (ref 39–117)
BUN: 17 mg/dL (ref 6–23)
CALCIUM: 9.2 mg/dL (ref 8.4–10.5)
CHLORIDE: 101 meq/L (ref 96–112)
CO2: 28 meq/L (ref 19–32)
Creatinine, Ser: 0.72 mg/dL (ref 0.40–1.20)
GFR: 94 mL/min (ref >60.00)
GLUCOSE: 90 mg/dL (ref 70–99)
Potassium: 4.3 mEq/L (ref 3.5–5.1)
Sodium: 136 mEq/L (ref 135–145)
Total Bilirubin: 0.7 mg/dL (ref 0.2–1.2)
Total Protein: 6.9 g/dL (ref 6.0–8.3)

## 2017-08-06 LAB — TSH: TSH: 1.39 u[IU]/mL (ref 0.35–4.50)

## 2017-08-06 LAB — VITAMIN D 25 HYDROXY (VIT D DEFICIENCY, FRACTURES): VITD: 31.59 ng/mL (ref 30.00–100.00)

## 2017-08-06 NOTE — Assessment & Plan Note (Signed)
Discussed lifestyle modifications including low glycemic low carbohydrate diet.  Increase exercise.  Limit, orrefrain completely from alcohol.  Will follow.  Pending TSH

## 2017-08-06 NOTE — Progress Notes (Signed)
Subjective:    Patient ID: Sheryl Morrow, female    DOB: 1975/06/01, 42 y.o.   MRN: [id number]  CC: Sheryl Morrow is a 42 y.o. female who presents today for physical exam.    HPI: Concerned with weight gain.  Describes in the past 6 months, her diet has not been as good or exercise.  She reports her father-in-law has passed away which is been quite stressed with the family.  She plans to get back into a better routine.  Depression- hasnt needed zoloft.Never started since last visit. Out of klonopin. Cannot remember last filled. Uses klonopin for flying or prn trouble sleeping. No si/hi.      Colorectal Cancer Screening: No early family history. Notes once a month, h/o hemorrhoid, notes small BRB on toilet paper. Can feel hemorrhoids. Worsened when had daughter ( she is 9 now). No melena, blood in stool.   Breast Cancer Screening: Mammogram due; scheduled.  Cervical Cancer Screening: UTD 2017.  Bone Health screening/DEXA for 65+: No increased fracture risk. Defer screening at this time. Lung Cancer Screening: Doesn't have 30 year pack year history and age > 46 years. Immunizations       Tetanus - due       Labs: Screening labs today. Exercise: Gets regular exercise.  Alcohol use: moderate Smoking/tobacco use: Nonsmoker.  Regular dental exams: utd Wears seat belt: Yes. Skin: had annual exam; appointment for annual skin check is already scheduled   HISTORY:  Past Medical History:  Diagnosis Date  . Hemorrhoids     Past Surgical History:  Procedure Laterality Date  . APPENDECTOMY    . CESAREAN SECTION  09/2008  . CYSTOSCOPY WITH URETHRAL DILATATION  2008  . DILATION AND CURETTAGE OF UTERUS     twice in 2007  . GALLBLADDER SURGERY     2007  . HERNIA REPAIR     embilical  . UMBILICAL HERNIA REPAIR  12/2007   Family History  Problem Relation Age of Onset  . Heart disease Father        Bypass  . Hypertension Father   . Diabetes Paternal Grandmother        ALLERGIES: Patient has no known allergies.  Current Outpatient Medications on File Prior to Visit  Medication Sig Dispense Refill  . Ascorbic Acid (VITAMIN C) 1000 MG tablet Take 1,000 mg by mouth daily.    . clonazePAM (KLONOPIN) 0.5 MG tablet TAKE 1 TABLET TWICE A DAY AS NEEDED FOR ANXIETY 60 tablet 1   No current facility-administered medications on file prior to visit.     Social History   Tobacco Use  . Smoking status: Former Smoker    Types: Cigarettes  . Smokeless tobacco: Never Used  Substance Use Topics  . Alcohol use: Yes    Alcohol/week: 0.0 oz    Comment: Moderate use  . Drug use: No    Review of Systems  Constitutional: Negative for chills, fever and unexpected weight change.  HENT: Negative for congestion.   Respiratory: Negative for cough.   Cardiovascular: Negative for chest pain, palpitations and leg swelling.  Gastrointestinal: Positive for anal bleeding. Negative for abdominal pain, blood in stool, nausea and vomiting.  Genitourinary: Positive for vaginal bleeding (menses today).  Musculoskeletal: Negative for arthralgias and myalgias.  Skin: Negative for rash.  Neurological: Negative for headaches.  Hematological: Negative for adenopathy.  Psychiatric/Behavioral: Negative for confusion. The patient is nervous/anxious.       Objective:    BP 126/78   Pulse  67   Temp 98.1 F (36.7 C) (Oral)   Resp 16   Ht 5' 6"  (1.676 m)   Wt 213 lb 8 oz (96.8 kg)   LMP 08/03/2017   SpO2 98%   BMI 34.46 kg/m   BP Readings from Last 3 Encounters:  08/06/17 126/78  07/25/16 140/90  02/08/16 117/77   Wt Readings from Last 3 Encounters:  08/06/17 213 lb 8 oz (96.8 kg)  07/25/16 207 lb 8 oz (94.1 kg)  02/08/16 201 lb 8 oz (91.4 kg)    Physical Exam  Constitutional: She appears well-developed and well-nourished.  Eyes: Conjunctivae are normal.  Neck: No thyroid mass and no thyromegaly present.  Cardiovascular: Normal rate, regular rhythm, normal heart  sounds and normal pulses.  Pulmonary/Chest: Effort normal and breath sounds normal. She has no wheezes. She has no rhonchi. She has no rales. Right breast exhibits no inverted nipple, no mass, no nipple discharge, no skin change and no tenderness. Left breast exhibits no inverted nipple, no mass, no nipple discharge, no skin change and no tenderness. Breasts are symmetrical.  CBE performed.   Lymphadenopathy:       Head (right side): No submental, no submandibular, no tonsillar, no preauricular, no posterior auricular and no occipital adenopathy present.       Head (left side): No submental, no submandibular, no tonsillar, no preauricular, no posterior auricular and no occipital adenopathy present.    She has no cervical adenopathy.       Right cervical: No superficial cervical, no deep cervical and no posterior cervical adenopathy present.      Left cervical: No superficial cervical, no deep cervical and no posterior cervical adenopathy present.    She has no axillary adenopathy.  Neurological: She is alert.  Skin: Skin is warm and dry.  Psychiatric: She has a normal mood and affect. Her speech is normal and behavior is normal. Thought content normal.  Vitals reviewed.      Assessment & Plan:   Problem List Items Addressed This Visit      Cardiovascular and Mediastinum   Hemorrhoids    Chronic.  Pending occult stool cards and also consult with GI as patient interested in treatment of hemorrhoids and also colonoscopy. Will follow. Pending cbc as well.         Digestive   Rectal bleeding   Relevant Orders   Fecal occult blood, imunochemical   Ambulatory referral to Gastroenterology     Other   Routine physical examination - Primary    Clinical breast exam performed today.  Mammogram scheduled. Pap is up-to-date.  In the absence of pelvic complaints, deferred pelvic exam today.  Patient has some trepidation with doing Pap smear every 3 years as recommended by acog guidelines as I  explained to her.  Offered the Pap smear today however she is on her menses.  She will consider whether or not she like to do a Pap smear annually which I advised that I was happy to do.  She will check with her insurance in terms of cost and let me know if she would like to have a Pap smear at her next appointment.Tdap given.       Relevant Orders   CBC with Differential/Platelet   Comprehensive metabolic panel   Anxiety    Using Klonopin very rarely.  Anxiety controlled, predominantly situational.  Will continue current regimen.      Weight gain    Discussed lifestyle modifications including low glycemic low carbohydrate  diet.  Increase exercise.  Limit, orrefrain completely from alcohol.  Will follow.  Pending TSH      Relevant Orders   TSH   Elevated cholesterol    Elevated in the past.  Pending lipid panel.      Relevant Orders   Lipid panel   Exposure to hepatitis C   Relevant Orders   Hepatitis C antibody   Abnormal CBC   Relevant Orders   CBC with Differential/Platelet   Vitamin D deficiency   Relevant Orders   VITAMIN D 25 Hydroxy (Vit-D Deficiency, Fractures)       I have discontinued Barnetta Chapel Dilday's Fish Oil, omeprazole, hydrocortisone, hydrocortisone, and sertraline. I am also having her maintain her clonazePAM and vitamin C.   No orders of the defined types were placed in this encounter.   Return precautions given.   Risks, benefits, and alternatives of the medications and treatment plan prescribed today were discussed, and patient expressed understanding.   Education regarding symptom management and diagnosis given to patient on AVS.   Continue to follow with Burnard Hawthorne, FNP for routine health maintenance.   Armstead Peaks and I agreed with plan.   Mable Paris, FNP

## 2017-08-06 NOTE — Addendum Note (Signed)
Addended by: Johna Sheriff on: 08/06/2017 10:32 AM   Modules accepted: Orders

## 2017-08-06 NOTE — Patient Instructions (Addendum)
May come back for Pap smear.   Stool cards- please return  Today we discussed referrals, orders.  GI for hemorrhoids   I have placed these orders in the system for you.  Please be sure to give [place] a call if you have not heard from our office regarding scheduling a test or regarding referral in a timely manner.  It is very important that you let me know as soon as possible.   Health Maintenance, Female Adopting a healthy lifestyle and getting preventive care can go a long way to promote health and wellness. Talk with your health care provider about what schedule of regular examinations is right for you. This is a good chance for you to check in with your provider about disease prevention and staying healthy. In between checkups, there are plenty of things you can do on your own. Experts have done a lot of research about which lifestyle changes and preventive measures are most likely to keep you healthy. Ask your health care provider for more information. Weight and diet Eat a healthy diet  Be sure to include plenty of vegetables, fruits, low-fat dairy products, and lean protein.  Do not eat a lot of foods high in solid fats, added sugars, or salt.  Get regular exercise. This is one of the most important things you can do for your health. ? Most adults should exercise for at least 150 minutes each week. The exercise should increase your heart rate and make you sweat (moderate-intensity exercise). ? Most adults should also do strengthening exercises at least twice a week. This is in addition to the moderate-intensity exercise.  Maintain a healthy weight  Body mass index (BMI) is a measurement that can be used to identify possible weight problems. It estimates body fat based on height and weight. Your health care provider can help determine your BMI and help you achieve or maintain a healthy weight.  For females 65 years of age and older: ? A BMI below 18.5 is considered underweight. ? A BMI of  18.5 to 24.9 is normal. ? A BMI of 25 to 29.9 is considered overweight. ? A BMI of 30 and above is considered obese.  Watch levels of cholesterol and blood lipids  You should start having your blood tested for lipids and cholesterol at 43 years of age, then have this test every 5 years.  You may need to have your cholesterol levels checked more often if: ? Your lipid or cholesterol levels are high. ? You are older than 43 years of age. ? You are at high risk for heart disease.  Cancer screening Lung Cancer  Lung cancer screening is recommended for adults 39-57 years old who are at high risk for lung cancer because of a history of smoking.  A yearly low-dose CT scan of the lungs is recommended for people who: ? Currently smoke. ? Have quit within the past 15 years. ? Have at least a 30-pack-year history of smoking. A pack year is smoking an average of one pack of cigarettes a day for 1 year.  Yearly screening should continue until it has been 15 years since you quit.  Yearly screening should stop if you develop a health problem that would prevent you from having lung cancer treatment.  Breast Cancer  Practice breast self-awareness. This means understanding how your breasts normally appear and feel.  It also means doing regular breast self-exams. Let your health care provider know about any changes, no matter how small.  If you are in your 20s or 30s, you should have a clinical breast exam (CBE) by a health care provider every 1-3 years as part of a regular health exam.  If you are 75 or older, have a CBE every year. Also consider having a breast X-ray (mammogram) every year.  If you have a family history of breast cancer, talk to your health care provider about genetic screening.  If you are at high risk for breast cancer, talk to your health care provider about having an MRI and a mammogram every year.  Breast cancer gene (BRCA) assessment is recommended for women who have  family members with BRCA-related cancers. BRCA-related cancers include: ? Breast. ? Ovarian. ? Tubal. ? Peritoneal cancers.  Results of the assessment will determine the need for genetic counseling and BRCA1 and BRCA2 testing.  Cervical Cancer Your health care provider may recommend that you be screened regularly for cancer of the pelvic organs (ovaries, uterus, and vagina). This screening involves a pelvic examination, including checking for microscopic changes to the surface of your cervix (Pap test). You may be encouraged to have this screening done every 3 years, beginning at age 69.  For women ages 54-65, health care providers may recommend pelvic exams and Pap testing every 3 years, or they may recommend the Pap and pelvic exam, combined with testing for human papilloma virus (HPV), every 5 years. Some types of HPV increase your risk of cervical cancer. Testing for HPV may also be done on women of any age with unclear Pap test results.  Other health care providers may not recommend any screening for nonpregnant women who are considered low risk for pelvic cancer and who do not have symptoms. Ask your health care provider if a screening pelvic exam is right for you.  If you have had past treatment for cervical cancer or a condition that could lead to cancer, you need Pap tests and screening for cancer for at least 20 years after your treatment. If Pap tests have been discontinued, your risk factors (such as having a new sexual partner) need to be reassessed to determine if screening should resume. Some women have medical problems that increase the chance of getting cervical cancer. In these cases, your health care provider may recommend more frequent screening and Pap tests.  Colorectal Cancer  This type of cancer can be detected and often prevented.  Routine colorectal cancer screening usually begins at 43 years of age and continues through 43 years of age.  Your health care provider may  recommend screening at an earlier age if you have risk factors for colon cancer.  Your health care provider may also recommend using home test kits to check for hidden blood in the stool.  A small camera at the end of a tube can be used to examine your colon directly (sigmoidoscopy or colonoscopy). This is done to check for the earliest forms of colorectal cancer.  Routine screening usually begins at age 52.  Direct examination of the colon should be repeated every 5-10 years through 43 years of age. However, you may need to be screened more often if early forms of precancerous polyps or small growths are found.  Skin Cancer  Check your skin from head to toe regularly.  Tell your health care provider about any new moles or changes in moles, especially if there is a change in a mole's shape or color.  Also tell your health care provider if you have a mole that is  larger than the size of a pencil eraser.  Always use sunscreen. Apply sunscreen liberally and repeatedly throughout the day.  Protect yourself by wearing long sleeves, pants, a wide-brimmed hat, and sunglasses whenever you are outside.  Heart disease, diabetes, and high blood pressure  High blood pressure causes heart disease and increases the risk of stroke. High blood pressure is more likely to develop in: ? People who have blood pressure in the high end of the normal range (130-139/85-89 mm Hg). ? People who are overweight or obese. ? People who are African American.  If you are 67-38 years of age, have your blood pressure checked every 3-5 years. If you are 41 years of age or older, have your blood pressure checked every year. You should have your blood pressure measured twice-once when you are at a hospital or clinic, and once when you are not at a hospital or clinic. Record the average of the two measurements. To check your blood pressure when you are not at a hospital or clinic, you can use: ? An automated blood pressure  machine at a pharmacy. ? A home blood pressure monitor.  If you are between 25 years and 30 years old, ask your health care provider if you should take aspirin to prevent strokes.  Have regular diabetes screenings. This involves taking a blood sample to check your fasting blood sugar level. ? If you are at a normal weight and have a low risk for diabetes, have this test once every three years after 43 years of age. ? If you are overweight and have a high risk for diabetes, consider being tested at a younger age or more often. Preventing infection Hepatitis B  If you have a higher risk for hepatitis B, you should be screened for this virus. You are considered at high risk for hepatitis B if: ? You were born in a country where hepatitis B is common. Ask your health care provider which countries are considered high risk. ? Your parents were born in a high-risk country, and you have not been immunized against hepatitis B (hepatitis B vaccine). ? You have HIV or AIDS. ? You use needles to inject street drugs. ? You live with someone who has hepatitis B. ? You have had sex with someone who has hepatitis B. ? You get hemodialysis treatment. ? You take certain medicines for conditions, including cancer, organ transplantation, and autoimmune conditions.  Hepatitis C  Blood testing is recommended for: ? Everyone born from [birth year] through 1966. ? Anyone with known risk factors for hepatitis C.  Sexually transmitted infections (STIs)  You should be screened for sexually transmitted infections (STIs) including gonorrhea and chlamydia if: ? You are sexually active and are younger than 44 years of age. ? You are older than 43 years of age and your health care provider tells you that you are at risk for this type of infection. ? Your sexual activity has changed since you were last screened and you are at an increased risk for chlamydia or gonorrhea. Ask your health care provider if you are at  risk.  If you do not have HIV, but are at risk, it may be recommended that you take a prescription medicine daily to prevent HIV infection. This is called pre-exposure prophylaxis (PrEP). You are considered at risk if: ? You are sexually active and do not regularly use condoms or know the HIV status of your partner(s). ? You take drugs by injection. ? You are sexually active  with a partner who has HIV.  Talk with your health care provider about whether you are at high risk of being infected with HIV. If you choose to begin PrEP, you should first be tested for HIV. You should then be tested every 3 months for as long as you are taking PrEP. Pregnancy  If you are premenopausal and you may become pregnant, ask your health care provider about preconception counseling.  If you may become pregnant, take 400 to 800 micrograms (mcg) of folic acid every day.  If you want to prevent pregnancy, talk to your health care provider about birth control (contraception). Osteoporosis and menopause  Osteoporosis is a disease in which the bones lose minerals and strength with aging. This can result in serious bone fractures. Your risk for osteoporosis can be identified using a bone density scan.  If you are 99 years of age or older, or if you are at risk for osteoporosis and fractures, ask your health care provider if you should be screened.  Ask your health care provider whether you should take a calcium or vitamin D supplement to lower your risk for osteoporosis.  Menopause may have certain physical symptoms and risks.  Hormone replacement therapy may reduce some of these symptoms and risks. Talk to your health care provider about whether hormone replacement therapy is right for you. Follow these instructions at home:  Schedule regular health, dental, and eye exams.  Stay current with your immunizations.  Do not use any tobacco products including cigarettes, chewing tobacco, or electronic  cigarettes.  If you are pregnant, do not drink alcohol.  If you are breastfeeding, limit how much and how often you drink alcohol.  Limit alcohol intake to no more than 1 drink per day for nonpregnant women. One drink equals 12 ounces of beer, 5 ounces of wine, or 1 ounces of hard liquor.  Do not use street drugs.  Do not share needles.  Ask your health care provider for help if you need support or information about quitting drugs.  Tell your health care provider if you often feel depressed.  Tell your health care provider if you have ever been abused or do not feel safe at home. This information is not intended to replace advice given to you by your health care provider. Make sure you discuss any questions you have with your health care provider. Document Released: 09/03/2010 Document Revised: 07/27/2015 Document Reviewed: 11/22/2014 Elsevier Interactive Patient Education  [employer].

## 2017-08-06 NOTE — Assessment & Plan Note (Signed)
Elevated in the past.  Pending lipid panel.

## 2017-08-06 NOTE — Assessment & Plan Note (Addendum)
Clinical breast exam performed today.  Mammogram scheduled. Pap is up-to-date.  In the absence of pelvic complaints, deferred pelvic exam today.  Patient has some trepidation with doing Pap smear every 3 years as recommended by acog guidelines as I explained to her.  Offered the Pap smear today however she is on her menses.  She will consider whether or not she like to do a Pap smear annually which I advised that I was happy to do.  She will check with her insurance in terms of cost and let me know if she would like to have a Pap smear at her next appointment.Tdap given.

## 2017-08-06 NOTE — Assessment & Plan Note (Signed)
Chronic.  Pending occult stool cards and also consult with GI as patient interested in treatment of hemorrhoids and also colonoscopy. Will follow. Pending cbc as well.

## 2017-08-06 NOTE — Assessment & Plan Note (Signed)
Using Klonopin very rarely.  Anxiety controlled, predominantly situational.  Will continue current regimen.

## 2017-08-07 LAB — HEPATITIS C ANTIBODY
Hepatitis C Ab: NONREACTIVE
SIGNAL TO CUT-OFF: 0.01 (ref <1.00)

## 2017-08-09 ENCOUNTER — Other Ambulatory Visit: Payer: Self-pay | Admitting: Family

## 2017-08-09 DIAGNOSIS — D721 Eosinophilia: Principal | ICD-10-CM

## 2017-08-09 DIAGNOSIS — R898 Other abnormal findings in specimens from other organs, systems and tissues: Secondary | ICD-10-CM

## 2017-08-27 ENCOUNTER — Ambulatory Visit
Admission: RE | Admit: 2017-08-27 | Discharge: 2017-08-27 | Disposition: A | Discharge status: Home / Self Care | Payer: BLUE CROSS/BLUE SHIELD | Source: Ambulatory Visit | Attending: Family | Admitting: Family

## 2017-08-27 DIAGNOSIS — Z1231 Encounter for screening mammogram for malignant neoplasm of breast: Secondary | ICD-10-CM | POA: Diagnosis present

## 2017-09-22 ENCOUNTER — Ambulatory Visit (INDEPENDENT_AMBULATORY_CARE_PROVIDER_SITE_OTHER): Payer: BLUE CROSS/BLUE SHIELD | Admitting: Gastroenterology

## 2017-09-22 ENCOUNTER — Encounter: Payer: Self-pay | Admitting: Gastroenterology

## 2017-09-22 ENCOUNTER — Other Ambulatory Visit: Payer: Self-pay

## 2017-09-22 ENCOUNTER — Encounter (INDEPENDENT_AMBULATORY_CARE_PROVIDER_SITE_OTHER): Payer: Self-pay

## 2017-09-22 VITALS — BP 146/94 | HR 67 | Ht 66.0 in | Wt 213.8 lb

## 2017-09-22 DIAGNOSIS — K625 Hemorrhage of anus and rectum: Secondary | ICD-10-CM

## 2017-09-22 NOTE — Progress Notes (Signed)
Jonathon Bellows MD, MRCP(U.K) [address]  [place]  [place], [place] [address]  Main: [phone]  Fax: [phone]   Gastroenterology Consultation  Referring Provider:     Burnard Hawthorne, FNP Primary Care Physician:  Burnard Hawthorne, FNP Primary Gastroenterologist:  Dr. Jonathon Bellows  Reason for Consultation:     Rectal bleeding         HPI:   Sheryl Morrow is a 42 y.o. y/o female referred for consultation & management  by Dr. Vidal Schwalbe, Yvetta Coder, FNP.     Rectal bleeding :  Onset and where was blood seen  :2004 - not worse , some spots of blood , twice a month on the toilet paper,  Frequency of bowel movements :1-2 twice  Daily, soft  Consistency : soft  Change in shape of stool:no  Pain associated with bowel movements:yes sometimes-hurts internally  Blood thinner usage:no  NSAID's: sometimes ibuprofen - 2-3 times a week , for many years  Prior colonoscopy :sigmoidoscopy x 2 in 2004, 2007 and colonoscopy in 2014 showing small hemorroids Family history of colon cancer or polyps:no  Weight loss:no      CBC Latest Ref Rng & Units 08/06/2017 07/25/2016 07/24/2015  WBC 4.0 - 10.5 K/uL 5.3 5.2 7.2  Hemoglobin 12.0 - 15.0 g/dL 13.0 13.5 13.6  Hematocrit 36.0 - 46.0 % 38.2 40.5 40.0  Platelets 150.0 - 400.0 K/uL 198.0 227.0 203.0     Past Medical History:  Diagnosis Date  . Hemorrhoids     Past Surgical History:  Procedure Laterality Date  . APPENDECTOMY    . CESAREAN SECTION  09/2008  . CYSTOSCOPY WITH URETHRAL DILATATION  2008  . DILATION AND CURETTAGE OF UTERUS     twice in 2007  . GALLBLADDER SURGERY     2007  . HERNIA REPAIR     embilical  . UMBILICAL HERNIA REPAIR  12/2007    Prior to Admission medications   Medication Sig Start Date End Date Taking? Authorizing Provider  Ascorbic Acid (VITAMIN C) 1000 MG tablet Take 1,000 mg by mouth daily.   Yes [provider]  clonazePAM (KLONOPIN) 0.5 MG tablet TAKE 1 TABLET TWICE A DAY AS  NEEDED FOR ANXIETY 04/18/17  Yes Arnett, Yvetta Coder, FNP    Family History  Problem Relation Age of Onset  . Heart disease Father        Bypass  . Hypertension Father   . Diabetes Paternal Grandmother   . Breast cancer Neg Hx      Social History   Tobacco Use  . Smoking status: Former Smoker    Types: Cigarettes  . Smokeless tobacco: Never Used  Substance Use Topics  . Alcohol use: Yes    Alcohol/week: 0.0 oz    Comment: Moderate use  . Drug use: No    Allergies as of 09/22/2017  . (No Known Allergies)    Review of Systems:    All systems reviewed and negative except where noted in HPI.   Physical Exam:  BP (!) 146/94   Pulse 67   Ht 5' 6"  (1.676 m)   Wt 213 lb 12.8 oz (97 kg)   BMI 34.51 kg/m  No LMP recorded. Psych:  Alert and cooperative. Normal mood and affect. General:   Alert,  Well-developed, well-nourished, pleasant and cooperative in NAD Head:  Normocephalic and atraumatic. Eyes:  Sclera clear, no icterus.   Conjunctiva pink. Ears:  Normal auditory acuity. Nose:  No deformity, discharge, or lesions.  Mouth:  No deformity or lesions,oropharynx pink & moist. Neck:  Supple; no masses or thyromegaly. Lungs:  Respirations even and unlabored.  Clear throughout to auscultation.   No wheezes, crackles, or rhonchi. No acute distress. Heart:  Regular rate and rhythm; no murmurs, clicks, rubs, or gallops. Abdomen:  Normal bowel sounds.  No bruits.  Soft, non-tender and non-distended without masses, hepatosplenomegaly or hernias noted.  No guarding or rebound tenderness.    Neurologic:  Alert and oriented x3;  grossly normal neurologically. Skin:  Intact without significant lesions or rashes. No jaundice. Lymph Nodes:  No significant cervical adenopathy. Psych:  Alert and cooperative. Normal mood and affect.  Imaging Studies: Mm 3d Screen Breast Bilateral  Result Date: 08/27/2017 CLINICAL DATA:  Screening. EXAM: DIGITAL SCREENING BILATERAL MAMMOGRAM WITH TOMO AND  CAD COMPARISON:  Previous exam(s). ACR Breast Density Category b: There are scattered areas of fibroglandular density. FINDINGS: There are no findings suspicious for malignancy. Images were processed with CAD. IMPRESSION: No mammographic evidence of malignancy. A result letter of this screening mammogram will be mailed directly to the patient. RECOMMENDATION: Screening mammogram in one year. (Code:SM-B-01Y) BI-RADS CATEGORY  1: Negative. Electronically Signed   By: Fidela Salisbury M.D.   On: 08/27/2017 15:19    Assessment and Plan:   Sheryl Morrow is a 42 y.o. y/o female has been referred for rectal bleeding  .    Plan : Colonoscopy and if negative and hemorroids only seen then can undergo banding here . She would prefer [place] location for her procedure.    I have discussed alternative options, risks & benefits,  which include, but are not limited to, bleeding, infection, perforation,respiratory complication & drug reaction.  The patient agrees with this plan & written consent will be obtained  Follow up PRN  Dr Jonathon Bellows MD,MRCP(U.K)

## 2017-09-23 ENCOUNTER — Encounter: Payer: Self-pay | Admitting: Gastroenterology

## 2017-09-24 NOTE — Telephone Encounter (Signed)
error 

## 2017-10-22 ENCOUNTER — Other Ambulatory Visit: Payer: Self-pay | Admitting: Family

## 2017-10-22 DIAGNOSIS — F419 Anxiety disorder, unspecified: Secondary | ICD-10-CM

## 2017-10-22 NOTE — Telephone Encounter (Signed)
Refill of klonopin  LRF 08/03/17  #60 1 refill  LOV 08/06/17 M. Arnett      [id number] [address] [place](Phone) [phone](Fax)

## 2017-10-22 NOTE — Telephone Encounter (Signed)
Copied from [place] [id number]. Topic: Quick Communication - Rx Refill/Question >> Oct 22, 2017 12:08 PM Yvette Rack wrote: Medication: clonazePAM (KLONOPIN) 0.5 MG tablet  pt insurance will expire on the august 31st need it done before then  Has the patient contacted their pharmacy? Yes.   (Agent: If no, request that the patient contact the pharmacy for the refill.) (Agent: If yes, when and what did the pharmacy advise?) computer told her to call provider  Preferred Pharmacy (with phone number or street name):   [id number] [phone](Phone) [phone](Fax)    Agent: Please be advised that RX refills may take up to 3 business days. We ask that you follow-up with your pharmacy.

## 2017-10-23 NOTE — Telephone Encounter (Signed)
Last filled 08/06/17 Last office visit 08/06/17 Next office visit 08/10/18

## 2017-10-23 NOTE — Telephone Encounter (Signed)
Is this from the patient?

## 2017-10-27 ENCOUNTER — Other Ambulatory Visit: Payer: BLUE CROSS/BLUE SHIELD

## 2017-10-27 NOTE — Telephone Encounter (Signed)
Patient checking status. Please advise. CB # [phone]

## 2017-10-27 NOTE — Telephone Encounter (Signed)
Refilled Call pt and let her know  I looked up patient on [place] Controlled Substances Reporting System and saw no activity that raised concern of inappropriate use.

## 2017-10-27 NOTE — Telephone Encounter (Signed)
Patient notified

## 2017-10-27 NOTE — Telephone Encounter (Signed)
Left voicemail for patient ? Correct number.  Joycelyn Schmid out of office. Margaret see refill request below  Last refill 08/06/17 Last office visit 08/06/17 Next office visit 08/10/18

## 2017-11-17 ENCOUNTER — Other Ambulatory Visit: Payer: BLUE CROSS/BLUE SHIELD

## 2018-06-16 ENCOUNTER — Other Ambulatory Visit: Payer: Self-pay | Admitting: Family

## 2018-06-16 DIAGNOSIS — F419 Anxiety disorder, unspecified: Secondary | ICD-10-CM

## 2018-06-16 NOTE — Telephone Encounter (Signed)
Last OV 08/06/2017   Last refilled 10/27/2017 disp 60 with 1 refill   Next OV 08/10/2018  Sent to PCP for approval

## 2018-06-17 NOTE — Telephone Encounter (Signed)
Call pt  She has not been seen in 3 months for  Controlled sub refill of klonopin Please sch virtual

## 2018-06-18 NOTE — Telephone Encounter (Signed)
I called the pt and scheduled a virtual visit with her for hr controlled substance medication, pt scheduled but wanted the provider to understand that she has medicaid right now and will get her insurance back in a month, so she knows she will have to pay out of pocket for the visit and she stated she wanted to see is you can charge her the lowest level possible or do a telephone visit if it is cheaper than the virtual.  Nina,cma

## 2018-06-19 ENCOUNTER — Ambulatory Visit (INDEPENDENT_AMBULATORY_CARE_PROVIDER_SITE_OTHER): Payer: Medicaid Other | Admitting: Family

## 2018-06-19 ENCOUNTER — Encounter: Payer: Self-pay | Admitting: Family

## 2018-06-19 DIAGNOSIS — F419 Anxiety disorder, unspecified: Secondary | ICD-10-CM

## 2018-06-19 MED ORDER — CLONAZEPAM 0.5 MG PO TABS: ORAL_TABLET | ORAL | 1 refills | Status: DC

## 2018-06-19 NOTE — Assessment & Plan Note (Addendum)
Doing well on prn Klonopin.  Understands risks of this medication.  At this time we discussed that based on her limited use, unlikely that she needs an SSRI at this time.  She will certainly let me know if this were to change   I looked up patient on [place] Controlled Substances Reporting System and saw no activity that raised concern of inappropriate use.   Of note, she will return for labs and follow-up in June 2020.

## 2018-06-19 NOTE — Progress Notes (Signed)
This visit type was conducted due to national recommendations for restrictions regarding the COVID-19 pandemic (e.g. social distancing).  This format is felt to be most appropriate for this patient at this time.  All issues noted in this document were discussed and addressed.  No physical exam was performed (except for noted visual exam findings with Video Visits). Virtual Visit via Video Note  I connected with@  on 06/19/18 at  8:30 AM EDT by a video enabled telemedicine application and verified that I am speaking with the correct person using two identifiers.  Location patient: home Location provider:work  Persons participating in the virtual visit: patient, provider  I discussed the limitations of evaluation and management by telemedicine and the availability of in person appointments. The patient expressed understanding and agreed to proceed.  Interactive audio and video telecommunications were attempted between this provider and patient, however failed, due to patient having technical difficulties or patient did not have access to video capability.  We continued and completed visit with audio only.   HPI: Feels well today.   GAD- comes and goes. Overall 'controlled with exercise'. uses klonopin for prn sleep or anxiety. Uses once a week. No depression.  No si/hi.  Would like a refill, although she notes she still has tablets .   Would like to come do labs this summer.    ROS: See pertinent positives and negatives per HPI.  Past Medical History:  Diagnosis Date  . Hemorrhoids     Past Surgical History:  Procedure Laterality Date  . APPENDECTOMY    . CESAREAN SECTION  09/2008  . CYSTOSCOPY WITH URETHRAL DILATATION  2008  . DILATION AND CURETTAGE OF UTERUS     twice in 2007  . GALLBLADDER SURGERY     2007  . HERNIA REPAIR     embilical  . UMBILICAL HERNIA REPAIR  12/2007    Family History  Problem Relation Age of Onset  . Heart disease Father        Bypass  .  Hypertension Father   . Diabetes Paternal Grandmother   . High Cholesterol Mother   . Breast cancer Neg Hx     SOCIAL HX: never smoker   Current Outpatient Medications:  .  Ascorbic Acid (VITAMIN C) 1000 MG tablet, Take 1,000 mg by mouth daily., Disp: , Rfl:  .  Cholecalciferol (VITAMIN D3) 50 MCG (2000 UT) TABS, Take 1 tablet by mouth 3 (three) times a week., Disp: , Rfl:  .  clonazePAM (KLONOPIN) 0.5 MG tablet, TAKE 1 TABLET BY MOUTH TWICE A DAY AS NEEDED FOR ANXIETY, Disp: 60 tablet, Rfl: 1  ASSESSMENT AND PLAN:  Discussed the following assessment and plan:  Anxiety - Plan: clonazePAM (KLONOPIN) 0.5 MG tablet, CBC with Differential/Platelet, Comprehensive metabolic panel, Hemoglobin A1c, Lipid panel, VITAMIN D 25 Hydroxy (Vit-D Deficiency, Fractures), TSH  Problem List Items Addressed This Visit      Other   Anxiety - Primary    Doing well on prn Klonopin.  Understands risks of this medication.  At this time we discussed that based on her limited use, unlikely that she needs an SSRI at this time.  She will certainly let me know if this were to change   I looked up patient on [place] Controlled Substances Reporting System and saw no activity that raised concern of inappropriate use.   Of note, she will return for labs and follow-up in June 2020.       Relevant Medications   clonazePAM (KLONOPIN)  0.5 MG tablet   Other Relevant Orders   CBC with Differential/Platelet   Comprehensive metabolic panel   Hemoglobin A1c   Lipid panel   VITAMIN D 25 Hydroxy (Vit-D Deficiency, Fractures)   TSH        I discussed the assessment and treatment plan with the patient. The patient was provided an opportunity to ask questions and all were answered. The patient agreed with the plan and demonstrated an understanding of the instructions.   The patient was advised to call back or seek an in-person evaluation if the symptoms worsen or if the condition fails to improve as  anticipated.   Mable Paris, FNP   I spent 15 min non face to face time w/ pt

## 2018-06-19 NOTE — Patient Instructions (Signed)
Nice to speak with you.   Please schedule fasting labs when you can  Let me know if anything at all.

## 2018-06-29 IMAGING — MG MM DIGITAL DIAGNOSTIC UNILAT*R* W/ TOMO W/ CAD
6 series · 6 of 14 positions shown · non-contrast
Comparison: Previous exam(s).

CLINICAL DATA: Screening recall for a possible asymmetry in the
right breast noted on the CC view only.

EXAM:
2D DIGITAL DIAGNOSTIC RIGHT MAMMOGRAM WITH CAD AND ADJUNCT TOMO
ULTRASOUND RIGHT BREAST

[R ML synth-2D]
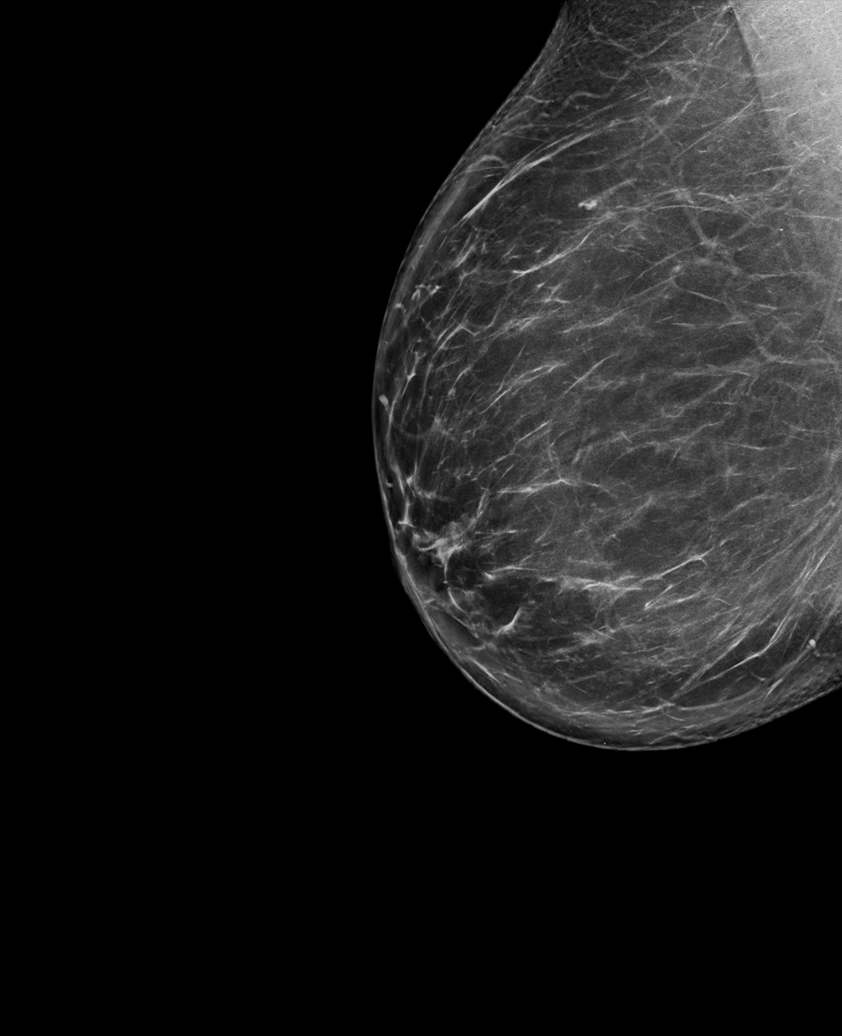

[R CC synth-2D]
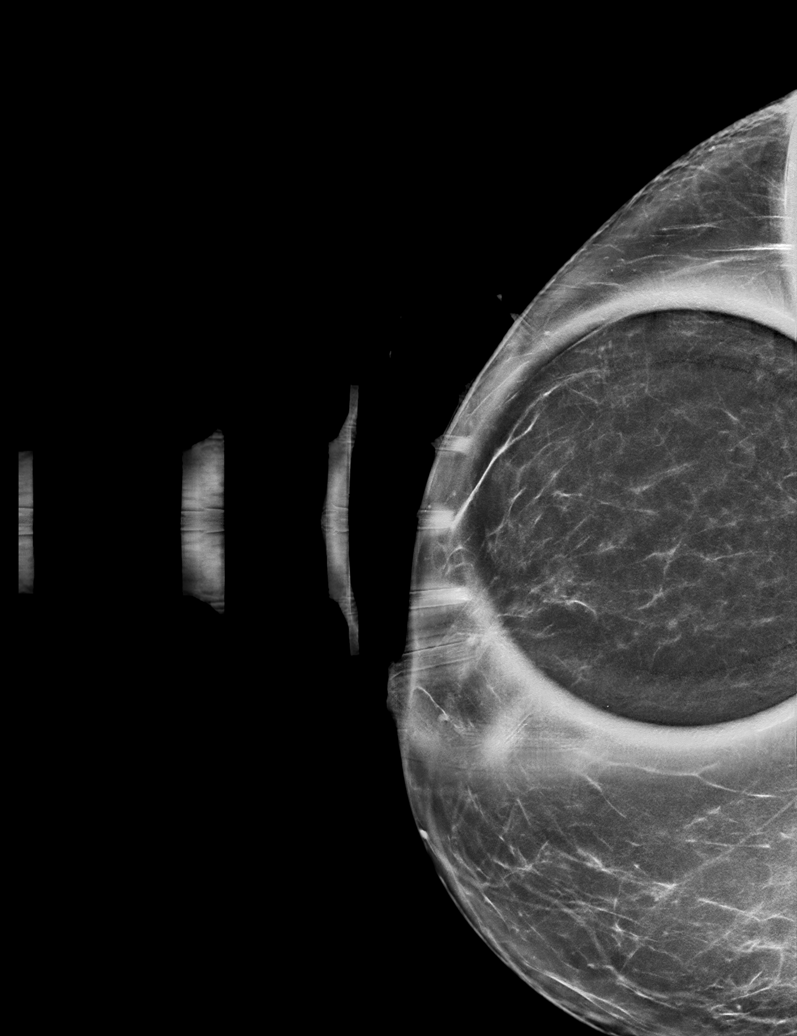

[R CC]
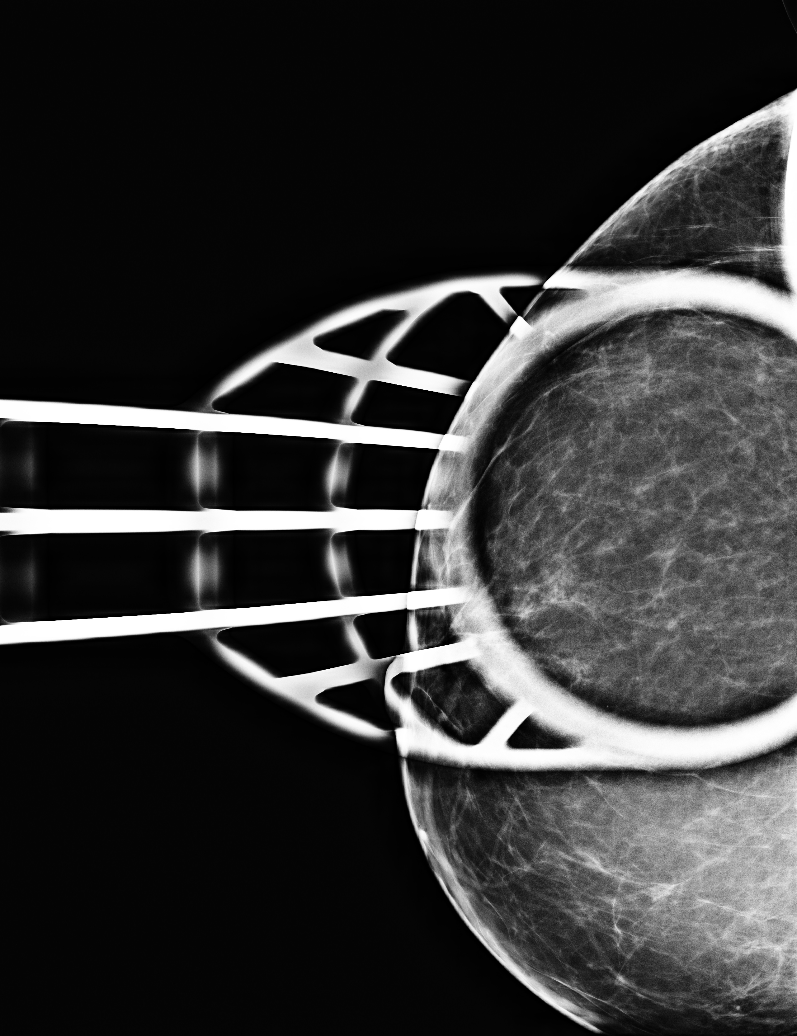

[R ML]
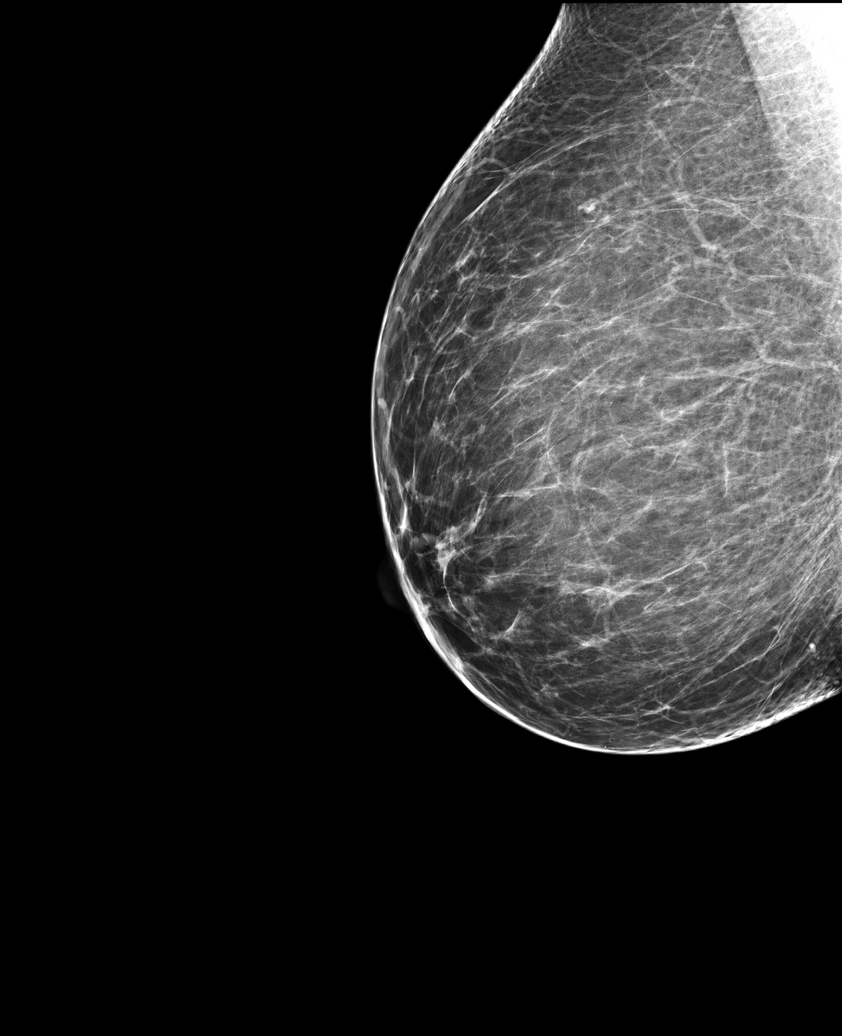

[R ML tomo · tomo slice 48/95.0]
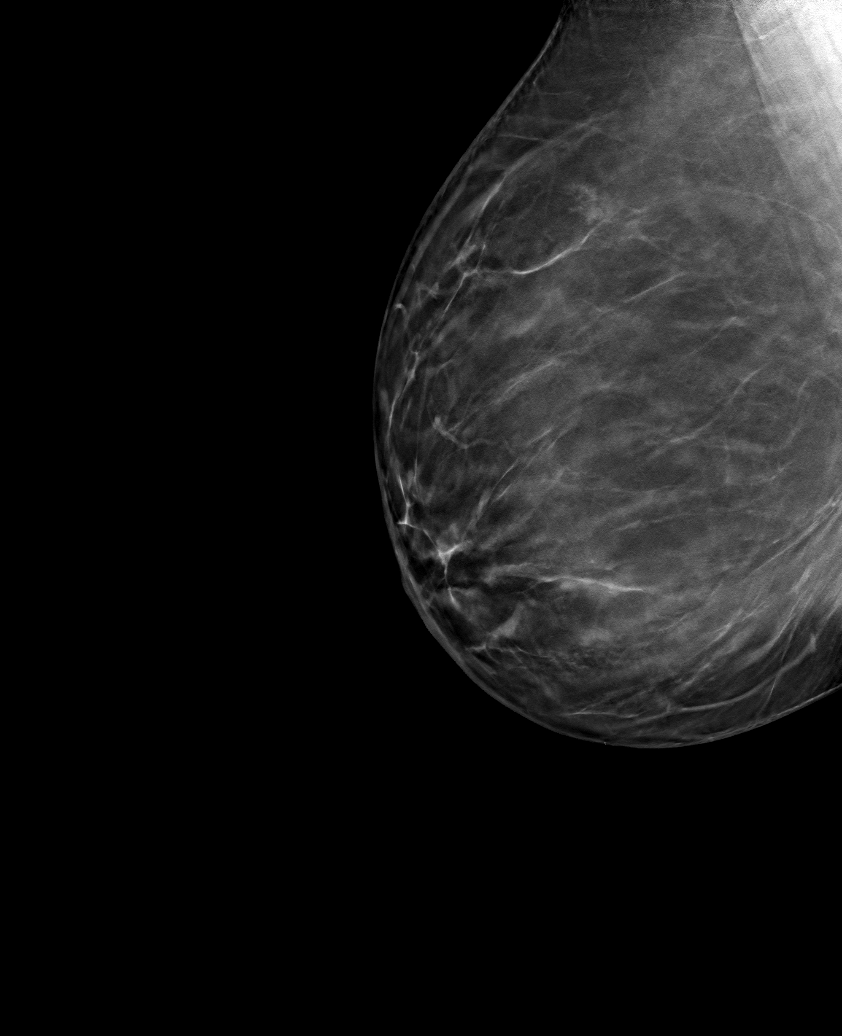

[R CC tomo · tomo slice 37/74.0]
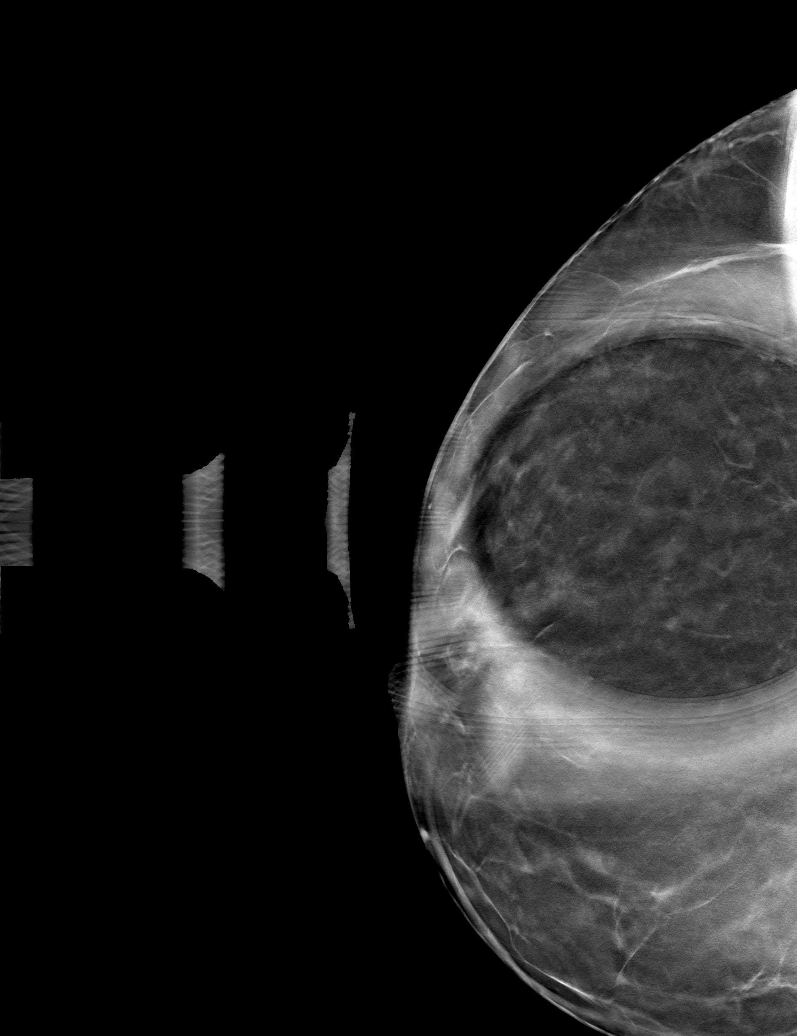

[6 of 14 positions shown; findings below may reference images not displayed]

ACR Breast Density Category b: There are scattered areas of
fibroglandular density.
FINDINGS: On the diagnostic spot-compression CC view, the possible asymmetry
disperses consistent with closely approximated fibroglandular
tissue. No abnormality is noted on the diagnostic ML view.

Mammographic images were processed with CAD.

Targeted ultrasound is performed, showing normal fibroglandular
tissue. The entire breast was insonated, with concentration on the
superior aspect of the right breast. No masses or cysts.
IMPRESSION: Normal exam.  No evidence of malignancy.

RECOMMENDATION:
Screening mammogram in one year.(Code:CT-I-3M3)

I have discussed the findings and recommendations with the patient.
Results were also provided in writing at the conclusion of the
visit. If applicable, a reminder letter will be sent to the patient
regarding the next appointment.

BI-RADS CATEGORY  1: Negative.

## 2018-06-29 IMAGING — US US BREAST*R* LIMITED INC AXILLA
1 series · 1 of 1 positions shown · non-contrast
Comparison: Previous exam(s).

CLINICAL DATA: Screening recall for a possible asymmetry in the
right breast noted on the CC view only.

EXAM:
2D DIGITAL DIAGNOSTIC RIGHT MAMMOGRAM WITH CAD AND ADJUNCT TOMO
ULTRASOUND RIGHT BREAST

[Series 1: us breast*right* limited inc axilla · 0.08mm/px · 1 of 1 slices shown]
[im 1/1]
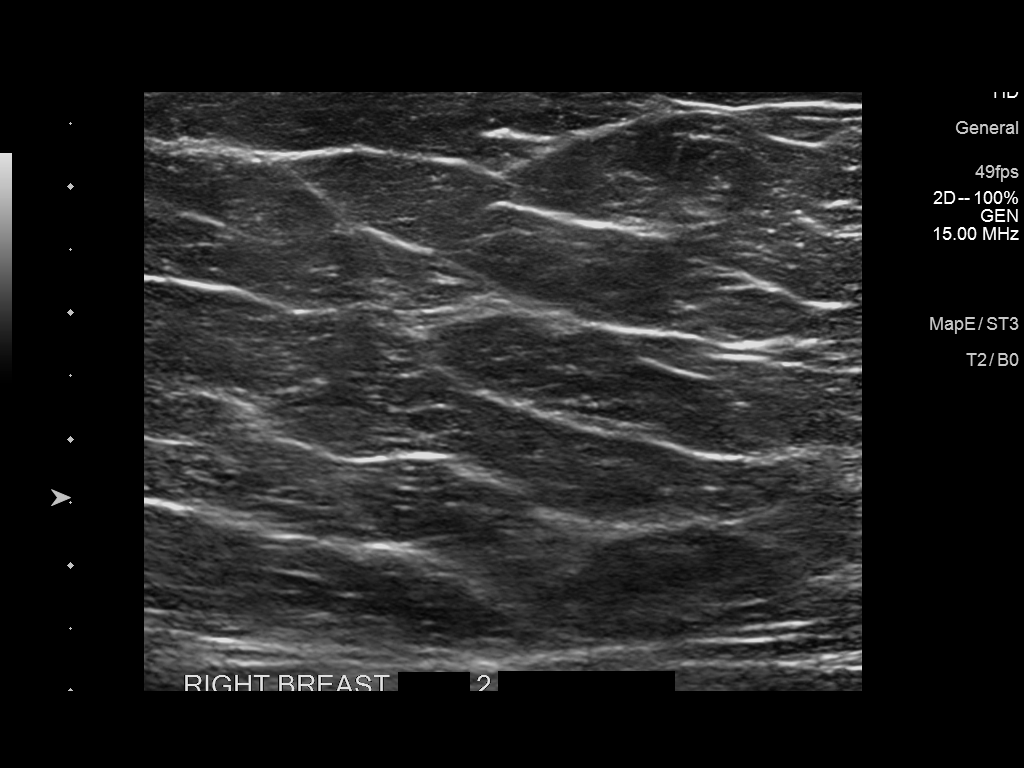

[1 of 1 positions shown; findings below may reference images not displayed]

ACR Breast Density Category b: There are scattered areas of
fibroglandular density.
FINDINGS: On the diagnostic spot-compression CC view, the possible asymmetry
disperses consistent with closely approximated fibroglandular
tissue. No abnormality is noted on the diagnostic ML view.

Mammographic images were processed with CAD.

Targeted ultrasound is performed, showing normal fibroglandular
tissue. The entire breast was insonated, with concentration on the
superior aspect of the right breast. No masses or cysts.
IMPRESSION: Normal exam.  No evidence of malignancy.

RECOMMENDATION:
Screening mammogram in one year.(Code:CT-I-3M3)

I have discussed the findings and recommendations with the patient.
Results were also provided in writing at the conclusion of the
visit. If applicable, a reminder letter will be sent to the patient
regarding the next appointment.

BI-RADS CATEGORY  1: Negative.

## 2018-08-07 ENCOUNTER — Other Ambulatory Visit: Payer: Self-pay

## 2018-08-10 ENCOUNTER — Encounter: Payer: BLUE CROSS/BLUE SHIELD | Admitting: Family

## 2018-08-24 ENCOUNTER — Encounter: Payer: Medicaid Other | Admitting: Family

## 2019-02-12 ENCOUNTER — Other Ambulatory Visit: Payer: Self-pay | Admitting: Family

## 2019-02-12 DIAGNOSIS — F419 Anxiety disorder, unspecified: Secondary | ICD-10-CM

## 2019-02-15 NOTE — Telephone Encounter (Signed)
Unable to access [facility] due to technical issues. No concerns in refilling this medication due to prior history.  She has an Upcoming appointment with me as well.

## 2019-03-19 ENCOUNTER — Other Ambulatory Visit: Payer: Self-pay

## 2019-03-19 ENCOUNTER — Ambulatory Visit (INDEPENDENT_AMBULATORY_CARE_PROVIDER_SITE_OTHER): Payer: Medicaid Other | Admitting: Family

## 2019-03-19 ENCOUNTER — Encounter: Payer: Self-pay | Admitting: Family

## 2019-03-19 VITALS — Ht 67.0 in | Wt 202.0 lb

## 2019-03-19 DIAGNOSIS — F419 Anxiety disorder, unspecified: Secondary | ICD-10-CM

## 2019-03-19 DIAGNOSIS — Z1231 Encounter for screening mammogram for malignant neoplasm of breast: Secondary | ICD-10-CM | POA: Diagnosis not present

## 2019-03-19 DIAGNOSIS — K219 Gastro-esophageal reflux disease without esophagitis: Secondary | ICD-10-CM

## 2019-03-19 NOTE — Assessment & Plan Note (Signed)
Stable at baseline, using Klonopin very sparingly.  Will continue. Of note, patient will return to clinic for Pap, CPE, labs.

## 2019-03-19 NOTE — Progress Notes (Signed)
Virtual Visit via Video Note  I connected with@  on 03/19/19 at  2:00 PM EST by a video enabled telemedicine application and verified that I am speaking with the correct person using two identifiers.  Location patient: home Location provider:work  Persons participating in the virtual visit: patient, provider  I discussed the limitations of evaluation and management by telemedicine and the availability of in person appointments. The patient expressed understanding and agreed to proceed.   HPI: Follow-up Doing well. No complaints.   Walking most days of week.  No CP.  Anxiety-as needed klonopin for sleeping . Uses very rarely.   GERD- no symptoms at this time. No longer on medication.    Due pap,cme   ROS: See pertinent positives and negatives per HPI.  Past Medical History:  Diagnosis Date  . Hemorrhoids     Past Surgical History:  Procedure Laterality Date  . APPENDECTOMY    . CESAREAN SECTION  09/2008  . CYSTOSCOPY WITH URETHRAL DILATATION  2008  . DILATION AND CURETTAGE OF UTERUS     twice in 2007  . GALLBLADDER SURGERY     2007  . HERNIA REPAIR     embilical  . UMBILICAL HERNIA REPAIR  12/2007    Family History  Problem Relation Age of Onset  . Heart disease Father        Bypass  . Hypertension Father   . Diabetes Paternal Grandmother   . High Cholesterol Mother   . Breast cancer Neg Hx     SOCIAL HX: former smoker   Current Outpatient Medications:  .  Ascorbic Acid (VITAMIN C) 1000 MG tablet, Take 1,000 mg by mouth daily., Disp: , Rfl:  .  Cholecalciferol (VITAMIN D3) 50 MCG (2000 UT) TABS, Take 1 tablet by mouth 3 (three) times a week., Disp: , Rfl:  .  clonazePAM (KLONOPIN) 0.5 MG tablet, TAKE 1 TABLET BY MOUTH TWICE A DAY AS NEEDED FOR ANXIETY, Disp: 60 tablet, Rfl: 1 .  omega-3 fish oil (MAXEPA) 1000 MG CAPS capsule, Take 1 capsule by mouth 2 (two) times daily., Disp: , Rfl:   EXAM:  VITALS per patient if applicable:  GENERAL: alert,  oriented, appears well and in no acute distress  HEENT: atraumatic, conjunttiva clear, no obvious abnormalities on inspection of external nose and ears  NECK: normal movements of the head and neck  LUNGS: on inspection no signs of respiratory distress, breathing rate appears normal, no obvious gross SOB, gasping or wheezing  CV: no obvious cyanosis  MS: moves all visible extremities without noticeable abnormality  PSYCH/NEURO: pleasant and cooperative, no obvious depression or anxiety, speech and thought processing grossly intact  ASSESSMENT AND PLAN:  Discussed the following assessment and plan:  Anxiety - Plan: CBC with Differential/Platelet, Comprehensive metabolic panel-FUTURE, Hemoglobin A1c, Lipid panel-future, VITAMIN D 25 Hydroxy (Vit-D Deficiency, Fractures), TSH-future  Encounter for screening mammogram for malignant neoplasm of breast - Plan: 3D mammogram- MM SCREENING BREAST TOMO BILATERAL  Gastroesophageal reflux disease, unspecified whether esophagitis present Problem List Items Addressed This Visit      Digestive   GERD (gastroesophageal reflux disease)    Pleased that she is asymptomatic. resolved        Other   Anxiety - Primary    Stable at baseline, using Klonopin very sparingly.  Will continue. Of note, patient will return to clinic for Pap, CPE, labs.      Relevant Orders   CBC with Differential/Platelet   Comprehensive metabolic panel-FUTURE   Hemoglobin  A1c   Lipid panel-future   VITAMIN D 25 Hydroxy (Vit-D Deficiency, Fractures)   TSH-future    Other Visit Diagnoses    Encounter for screening mammogram for malignant neoplasm of breast       Relevant Orders   3D mammogram- MM SCREENING BREAST TOMO BILATERAL      -we discussed possible serious and likely etiologies, options for evaluation and workup, limitations of telemedicine visit vs in person visit, treatment, treatment risks and precautions. Pt prefers to treat via telemedicine  empirically rather then risking or undertaking an in person visit at this moment. Patient agrees to seek prompt in person care if worsening, new symptoms arise, or if is not improving with treatment.   I discussed the assessment and treatment plan with the patient. The patient was provided an opportunity to ask questions and all were answered. The patient agreed with the plan and demonstrated an understanding of the instructions.   The patient was advised to call back or seek an in-person evaluation if the symptoms worsen or if the condition fails to improve as anticipated.   Mable Paris, FNP

## 2019-03-19 NOTE — Assessment & Plan Note (Signed)
Pleased that she is asymptomatic. resolved

## 2019-03-30 ENCOUNTER — Encounter: Payer: Self-pay | Admitting: Family

## 2019-03-31 ENCOUNTER — Encounter: Payer: Self-pay | Admitting: Family

## 2019-10-19 ENCOUNTER — Other Ambulatory Visit: Payer: Self-pay | Admitting: Family

## 2019-10-19 DIAGNOSIS — F419 Anxiety disorder, unspecified: Secondary | ICD-10-CM

## 2019-10-20 NOTE — Telephone Encounter (Signed)
Call pt   I have refilled your klonopin without a refill.  However I wanted to remind you that this is controlled substance.   In order for me to prescribe medication,  patients must be seen every 4-5 months based on her rare use.   Please make follow-up appointment this month for any further refills.    I looked up patient on [employer] Controlled Substances Reporting System and saw no activity that raised concern of inappropriate use.

## 2019-10-26 NOTE — Telephone Encounter (Signed)
Patient scheduled for VV 9/27.

## 2019-11-29 ENCOUNTER — Encounter: Payer: Self-pay | Admitting: Family

## 2019-11-29 ENCOUNTER — Telehealth (INDEPENDENT_AMBULATORY_CARE_PROVIDER_SITE_OTHER): Payer: Medicaid Other | Admitting: Family

## 2019-11-29 DIAGNOSIS — F419 Anxiety disorder, unspecified: Secondary | ICD-10-CM | POA: Diagnosis not present

## 2019-11-29 NOTE — Progress Notes (Signed)
Virtual Visit via Video Note  I connected with@  on 11/29/19 at  4:00 PM EDT by a video enabled telemedicine application and verified that I am speaking with the correct person using two identifiers.  Location patient: home Location provider:work  Persons participating in the virtual visit: patient, provider  I discussed the limitations of evaluation and management by telemedicine and the availability of in person appointments. The patient expressed understanding and agreed to proceed.   HPI: Feels well today No complaints.  Family has well.   GAD- controlled. using klonopin half tablet once to twice per week with relief. Sleeping well.  No depression.   Due mammogram,pap  ROS: See pertinent positives and negatives per HPI.    EXAM:  VITALS per patient if applicable:  GENERAL: alert, oriented, appears well and in no acute distress  HEENT: atraumatic, conjunttiva clear, no obvious abnormalities on inspection of external nose and ears  NECK: normal movements of the head and neck  LUNGS: on inspection no signs of respiratory distress, breathing rate appears normal, no obvious gross SOB, gasping or wheezing  CV: no obvious cyanosis  MS: moves all visible extremities without noticeable abnormality  PSYCH/NEURO: pleasant and cooperative, no obvious depression or anxiety, speech and thought processing grossly intact  ASSESSMENT AND PLAN:  Discussed the following assessment and plan:  Problem List Items Addressed This Visit      Other   Anxiety    Stable, continue klonopin 0.38m prn. Using sparingly.      Relevant Orders   CBC with Differential/Platelet   Comprehensive metabolic panel   Hemoglobin A1c   Lipid panel   TSH   VITAMIN D 25 Hydroxy (Vit-D Deficiency, Fractures)    of note , she would like cpe labs done prior to cpe  -we discussed possible serious and likely etiologies, options for evaluation and workup, limitations of telemedicine visit vs in person  visit, treatment, treatment risks and precautions. Pt prefers to treat via telemedicine empirically rather then risking or undertaking an in person visit at this moment.  .   I discussed the assessment and treatment plan with the patient. The patient was provided an opportunity to ask questions and all were answered. The patient agreed with the plan and demonstrated an understanding of the instructions.   The patient was advised to call back or seek an in-person evaluation if the symptoms worsen or if the condition fails to improve as anticipated.   MMable Paris FNP

## 2019-11-29 NOTE — Assessment & Plan Note (Addendum)
Stable, continue klonopin 0.28m prn. Using sparingly.

## 2019-11-29 NOTE — Patient Instructions (Signed)
Nice to see you!   

## 2020-03-06 ENCOUNTER — Other Ambulatory Visit: Payer: Medicaid Other

## 2020-03-08 ENCOUNTER — Encounter: Payer: Medicaid Other | Admitting: Family

## 2020-03-09 ENCOUNTER — Encounter: Payer: Self-pay | Admitting: Family

## 2020-03-09 ENCOUNTER — Other Ambulatory Visit (HOSPITAL_COMMUNITY)
Admission: RE | Admit: 2020-03-09 | Discharge: 2020-03-09 | Disposition: A | Discharge status: Home / Self Care | Payer: 59 | Source: Ambulatory Visit | Attending: Family | Admitting: Family

## 2020-03-09 ENCOUNTER — Other Ambulatory Visit: Payer: Self-pay

## 2020-03-09 ENCOUNTER — Ambulatory Visit (INDEPENDENT_AMBULATORY_CARE_PROVIDER_SITE_OTHER): Payer: 59 | Admitting: Family

## 2020-03-09 ENCOUNTER — Other Ambulatory Visit (INDEPENDENT_AMBULATORY_CARE_PROVIDER_SITE_OTHER): Payer: 59

## 2020-03-09 VITALS — BP 130/84 | HR 89 | Temp 97.6°F | Ht 67.0 in | Wt 214.0 lb

## 2020-03-09 DIAGNOSIS — Z0000 Encounter for general adult medical examination without abnormal findings: Secondary | ICD-10-CM | POA: Insufficient documentation

## 2020-03-09 DIAGNOSIS — F419 Anxiety disorder, unspecified: Secondary | ICD-10-CM

## 2020-03-09 DIAGNOSIS — R030 Elevated blood-pressure reading, without diagnosis of hypertension: Secondary | ICD-10-CM

## 2020-03-09 LAB — LIPID PANEL
Cholesterol: 290 mg/dL — ABNORMAL HIGH (ref 0–200)
HDL: 116.4 mg/dL (ref >39.00)
LDL Cholesterol: 163 mg/dL — ABNORMAL HIGH (ref 0–99)
NonHDL: 173.22
Total CHOL/HDL Ratio: 2
Triglycerides: 51 mg/dL (ref 0.0–149.0)
VLDL: 10.2 mg/dL (ref 0.0–40.0)

## 2020-03-09 LAB — CBC WITH DIFFERENTIAL/PLATELET
Basophils Absolute: 0.1 10*3/uL (ref 0.0–0.1)
Basophils Relative: 1.3 % (ref 0.0–3.0)
Eosinophils Absolute: 0.3 10*3/uL (ref 0.0–0.7)
Eosinophils Relative: 4.4 % (ref 0.0–5.0)
HCT: 41.9 % (ref 36.0–46.0)
Hemoglobin: 14.1 g/dL (ref 12.0–15.0)
Lymphocytes Relative: 32.9 % (ref 12.0–46.0)
Lymphs Abs: 1.9 10*3/uL (ref 0.7–4.0)
MCHC: 33.6 g/dL (ref 30.0–36.0)
MCV: 96.7 fl (ref 78.0–100.0)
Monocytes Absolute: 0.6 10*3/uL (ref 0.1–1.0)
Monocytes Relative: 10.1 % (ref 3.0–12.0)
Neutro Abs: 3 10*3/uL (ref 1.4–7.7)
Neutrophils Relative %: 51.3 % (ref 43.0–77.0)
Platelets: 215 10*3/uL (ref 150.0–400.0)
RBC: 4.34 Mil/uL (ref 3.87–5.11)
RDW: 13.4 % (ref 11.5–15.5)
WBC: 5.8 10*3/uL (ref 4.0–10.5)

## 2020-03-09 LAB — COMPREHENSIVE METABOLIC PANEL
ALT: 24 U/L (ref 0–35)
AST: 27 U/L (ref 0–37)
Albumin: 4.8 g/dL (ref 3.5–5.2)
Alkaline Phosphatase: 57 U/L (ref 39–117)
BUN: 16 mg/dL (ref 6–23)
CO2: 30 mEq/L (ref 19–32)
Calcium: 9.4 mg/dL (ref 8.4–10.5)
Chloride: 95 mEq/L — ABNORMAL LOW (ref 96–112)
Creatinine, Ser: 0.8 mg/dL (ref 0.40–1.20)
GFR: 88.93 mL/min (ref >60.00)
Glucose, Bld: 94 mg/dL (ref 70–99)
Potassium: 4.5 mEq/L (ref 3.5–5.1)
Sodium: 132 mEq/L — ABNORMAL LOW (ref 135–145)
Total Bilirubin: 0.8 mg/dL (ref 0.2–1.2)
Total Protein: 7.4 g/dL (ref 6.0–8.3)

## 2020-03-09 LAB — HEMOGLOBIN A1C: Hgb A1c MFr Bld: 5.2 % (ref 4.6–6.5)

## 2020-03-09 LAB — TSH: TSH: 1.68 u[IU]/mL (ref 0.35–4.50)

## 2020-03-09 LAB — VITAMIN D 25 HYDROXY (VIT D DEFICIENCY, FRACTURES): VITD: 35.19 ng/mL (ref 30.00–100.00)

## 2020-03-09 NOTE — Progress Notes (Signed)
Subjective:    Patient ID: Sheryl Morrow, female    DOB: [birth year], 45 y.o.   MRN: [id number]  CC: Sheryl Morrow is a 45 y.o. female who presents today for physical exam.    HPI: Feels well today No new concerns.  Anxiety-well controlled. Very rare use to help with sleep. Takes half tablet Klonopin for anxiety with resolve.    Colorectal Cancer Screening: due Breast Cancer Screening: Mammogram due Cervical Cancer Screening: due           Tetanus - utd          Labs: Screening labs today. Exercise: Gets regular exercise, walking daily for 15 minutes a couple times per day with dog.  No cp.  Alcohol use:  moderate Smoking/tobacco use: former smoker.     HISTORY:  Past Medical History:  Diagnosis Date  . Hemorrhoids     Past Surgical History:  Procedure Laterality Date  . APPENDECTOMY    . CESAREAN SECTION  09/2008  . CYSTOSCOPY WITH URETHRAL DILATATION  2008  . DILATION AND CURETTAGE OF UTERUS     twice in 2007  . GALLBLADDER SURGERY     2007  . HERNIA REPAIR     embilical  . UMBILICAL HERNIA REPAIR  12/2007   Family History  Problem Relation Age of Onset  . Heart disease Father        Bypass  . Hypertension Father   . Diabetes Paternal Grandmother   . High Cholesterol Mother   . Breast cancer Neg Hx       ALLERGIES: Patient has no known allergies.  Current Outpatient Medications on File Prior to Visit  Medication Sig Dispense Refill  . Ascorbic Acid (VITAMIN C) 1000 MG tablet Take 1,000 mg by mouth daily.    . Cholecalciferol (VITAMIN D3) 50 MCG (2000 UT) TABS Take 1 tablet by mouth 3 (three) times a week.    . clonazePAM (KLONOPIN) 0.5 MG tablet TAKE 1 TABLET BY MOUTH TWICE A DAY AS NEEDED FOR ANXIETY 60 tablet 0  . omega-3 fish oil (MAXEPA) 1000 MG CAPS capsule Take 1 capsule by mouth 2 (two) times daily.     No current facility-administered medications on file prior to visit.    Social History   Tobacco Use  . Smoking status:  Former Smoker    Types: Cigarettes  . Smokeless tobacco: Never Used  Substance Use Topics  . Alcohol use: Yes    Alcohol/week: 0.0 standard drinks    Comment: Moderate use  . Drug use: No    Review of Systems  Constitutional: Negative for chills, fever and unexpected weight change.  HENT: Negative for congestion.   Respiratory: Negative for cough.   Cardiovascular: Negative for chest pain, palpitations and leg swelling.  Gastrointestinal: Negative for nausea and vomiting.  Musculoskeletal: Negative for arthralgias and myalgias.  Skin: Negative for rash.  Neurological: Negative for headaches.  Hematological: Negative for adenopathy.  Psychiatric/Behavioral: Negative for confusion and sleep disturbance. The patient is not nervous/anxious.       Objective:    BP 130/84 (BP Location: Left Arm, Patient Position: Sitting, Cuff Size: Large)   Pulse 89   Temp 97.6 F (36.4 C) (Oral)   Ht 5' 7"  (1.702 m)   Wt 214 lb (97.1 kg)   SpO2 97%   BMI 33.52 kg/m   BP Readings from Last 3 Encounters:  03/09/20 130/84  09/22/17 (!) 146/94  08/06/17 126/78   Wt Readings from Last  3 Encounters:  03/09/20 214 lb (97.1 kg)  11/29/19 200 lb (90.7 kg)  03/19/19 202 lb (91.6 kg)    Physical Exam Vitals reviewed.  Constitutional:      Appearance: She is well-developed and well-nourished.  Eyes:     Conjunctiva/sclera: Conjunctivae normal.  Neck:     Thyroid: No thyroid mass or thyromegaly.  Cardiovascular:     Rate and Rhythm: Normal rate and regular rhythm.     Pulses: Normal pulses.     Heart sounds: Normal heart sounds.  Pulmonary:     Effort: Pulmonary effort is normal.     Breath sounds: Normal breath sounds. No wheezing, rhonchi or rales.     Comments: No masses or asymmetry appreciated during CBE. Chest:  Breasts: Breasts are symmetrical.     Right: No inverted nipple, mass, nipple discharge, skin change or tenderness.     Left: No inverted nipple, mass, nipple discharge,  skin change or tenderness.    Genitourinary:    Cervix: No cervical motion tenderness, discharge or friability.     Uterus: Not enlarged, not fixed and not tender.      Adnexa:        Right: No mass, tenderness or fullness.         Left: No mass, tenderness or fullness.       Comments: Pap performed. No CMT. Unable to appreciated ovaries. Lymphadenopathy:     Head:     Right side of head: No submental, submandibular, tonsillar, preauricular, posterior auricular or occipital adenopathy.     Left side of head: No submental, submandibular, tonsillar, preauricular, posterior auricular or occipital adenopathy.     Cervical:     Right cervical: No superficial, deep or posterior cervical adenopathy.    Left cervical: No superficial, deep or posterior cervical adenopathy.     Upper Body:  No axillary adenopathy present.    Right upper body: No pectoral or lateral adenopathy.     Left upper body: No pectoral or lateral adenopathy.  Skin:    General: Skin is warm and dry.  Neurological:     Mental Status: She is alert.  Psychiatric:        Mood and Affect: Mood and affect normal.        Speech: Speech normal.        Behavior: Behavior normal.        Thought Content: Thought content normal.        Assessment & Plan:   Problem List Items Addressed This Visit      Other   Anxiety    Stable. Rare use of klonopin. Reminded of importance of storing in safe place and using sparingly. Continue klonopin 0.55m prn.       Elevated blood pressure reading    Elevated. Patient declines medicaton and would like to actively focus on lifestyle, primarily loosing weight. She will check BP at home. Close follow up.       Routine physical examination - Primary    CBE and pap smear performed. Colonoscopy ordered. Encouraged focus on walking and weight loss with goal bmi < 25. Patient will schedule mammogram.       Relevant Orders   Cytology - PAP( [facility])   MM 3D SCREEN BREAST BILATERAL    Ambulatory referral to Gastroenterology       I am having CCandyce Churnmaintain her vitamin C, Vitamin D3, omega-3 fish oil, and clonazePAM.   No orders of the defined types  were placed in this encounter.   Return precautions given.   Risks, benefits, and alternatives of the medications and treatment plan prescribed today were discussed, and patient expressed understanding.   Education regarding symptom management and diagnosis given to patient on AVS.   Continue to follow with Burnard Hawthorne, FNP for routine health maintenance.   Candyce Churn and I agreed with plan.   Mable Paris, FNP

## 2020-03-09 NOTE — Assessment & Plan Note (Addendum)
CBE and pap smear performed. Colonoscopy ordered. Encouraged focus on walking and weight loss with goal bmi < 25. Patient will schedule mammogram.

## 2020-03-09 NOTE — Patient Instructions (Addendum)
Please call  and schedule your 3D mammogram as discussed.   [facility] [place], [place]   Referral for colonoscopy. Let us know if you dont hear back within a week in regards to an appointment being scheduled.    It is imperative that you are seen AT least twice per year for labs and monitoring. Monitor blood pressure at home and me 5-6 reading on separate days. Goal is less than 120/80, based on newest guidelines, however we certainly want to be less than 130/80;  if persistently higher, please make sooner follow up appointment so we can recheck you blood pressure and manage/ adjust medications.   Health Maintenance, Female Adopting a healthy lifestyle and getting preventive care are important in promoting health and wellness. Ask your health care provider about:  The right schedule for you to have regular tests and exams.  Things you can do on your own to prevent diseases and keep yourself healthy. What should I know about diet, weight, and exercise? Eat a healthy diet   Eat a diet that includes plenty of vegetables, fruits, low-fat dairy products, and lean protein.  Do not eat a lot of foods that are high in solid fats, added sugars, or sodium. Maintain a healthy weight Body mass index (BMI) is used to identify weight problems. It estimates body fat based on height and weight. Your health care provider can help determine your BMI and help you achieve or maintain a healthy weight. Get regular exercise Get regular exercise. This is one of the most important things you can do for your health. Most adults should:  Exercise for at least 150 minutes each week. The exercise should increase your heart rate and make you sweat (moderate-intensity exercise).  Do strengthening exercises at least twice a week. This is in addition to the moderate-intensity exercise.  Spend less time sitting. Even light physical activity can be  beneficial. Watch cholesterol and blood lipids Have your blood tested for lipids and cholesterol at 46 years of age, then have this test every 5 years. Have your cholesterol levels checked more often if:  Your lipid or cholesterol levels are high.  You are older than 46 years of age.  You are at high risk for heart disease. What should I know about cancer screening? Depending on your health history and family history, you may need to have cancer screening at various ages. This may include screening for:  Breast cancer.  Cervical cancer.  Colorectal cancer.  Skin cancer.  Lung cancer. What should I know about heart disease, diabetes, and high blood pressure? Blood pressure and heart disease  High blood pressure causes heart disease and increases the risk of stroke. This is more likely to develop in people who have high blood pressure readings, are of African descent, or are overweight.  Have your blood pressure checked: ? Every 3-5 years if you are 16-29 years of age. ? Every year if you are 15 years old or older. Diabetes Have regular diabetes screenings. This checks your fasting blood sugar level. Have the screening done:  Once every three years after age 106 if you are at a normal weight and have a low risk for diabetes.  More often and at a younger age if you are overweight or have a high risk for diabetes. What should I know about preventing infection? Hepatitis B If you have a higher risk for hepatitis B, you should be screened for this  virus. Talk with your health care provider to find out if you are at risk for hepatitis B infection. Hepatitis C Testing is recommended for:  Everyone born from [birth year] through 1966.  Anyone with known risk factors for hepatitis C. Sexually transmitted infections (STIs)  Get screened for STIs, including gonorrhea and chlamydia, if: ? You are sexually active and are younger than 46 years of age. ? You are older than 46 years of age and  your health care provider tells you that you are at risk for this type of infection. ? Your sexual activity has changed since you were last screened, and you are at increased risk for chlamydia or gonorrhea. Ask your health care provider if you are at risk.  Ask your health care provider about whether you are at high risk for HIV. Your health care provider may recommend a prescription medicine to help prevent HIV infection. If you choose to take medicine to prevent HIV, you should first get tested for HIV. You should then be tested every 3 months for as long as you are taking the medicine. Pregnancy  If you are about to stop having your period (premenopausal) and you may become pregnant, seek counseling before you get pregnant.  Take 400 to 800 micrograms (mcg) of folic acid every day if you become pregnant.  Ask for birth control (contraception) if you want to prevent pregnancy. Osteoporosis and menopause Osteoporosis is a disease in which the bones lose minerals and strength with aging. This can result in bone fractures. If you are 26 years old or older, or if you are at risk for osteoporosis and fractures, ask your health care provider if you should:  Be screened for bone loss.  Take a calcium or vitamin D supplement to lower your risk of fractures.  Be given hormone replacement therapy (HRT) to treat symptoms of menopause. Follow these instructions at home: Lifestyle  Do not use any products that contain nicotine or tobacco, such as cigarettes, e-cigarettes, and chewing tobacco. If you need help quitting, ask your health care provider.  Do not use street drugs.  Do not share needles.  Ask your health care provider for help if you need support or information about quitting drugs. Alcohol use  Do not drink alcohol if: ? Your health care provider tells you not to drink. ? You are pregnant, may be pregnant, or are planning to become pregnant.  If you drink alcohol: ? Limit how  much you use to 0-1 drink a day. ? Limit intake if you are breastfeeding.  Be aware of how much alcohol is in your drink. In the [place] one drink equals one 12 oz bottle of beer (355 mL), one 5 oz glass of wine (148 mL), or one 1 oz glass of hard liquor (44 mL). General instructions  Schedule regular health, dental, and eye exams.  Stay current with your vaccines.  Tell your health care provider if: ? You often feel depressed. ? You have ever been abused or do not feel safe at home. Summary  Adopting a healthy lifestyle and getting preventive care are important in promoting health and wellness.  Follow your health care provider's instructions about healthy diet, exercising, and getting tested or screened for diseases.  Follow your health care provider's instructions on monitoring your cholesterol and blood pressure. This information is not intended to replace advice given to you by your health care provider. Make sure you discuss any questions you have with your health care provider. Document  Revised: 02/11/2018 Document Reviewed: 02/11/2018 Elsevier Patient Education  [place].

## 2020-03-09 NOTE — Assessment & Plan Note (Addendum)
Elevated. Patient declines medicaton and would like to actively focus on lifestyle, primarily loosing weight. She will check BP at home. Close follow up.

## 2020-03-09 NOTE — Assessment & Plan Note (Signed)
Stable. Rare use of klonopin. Reminded of importance of storing in safe place and using sparingly. Continue klonopin 0.58m prn.

## 2020-03-10 ENCOUNTER — Other Ambulatory Visit: Payer: Self-pay | Admitting: Family

## 2020-03-10 DIAGNOSIS — R899 Unspecified abnormal finding in specimens from other organs, systems and tissues: Secondary | ICD-10-CM

## 2020-03-13 ENCOUNTER — Telehealth: Payer: Self-pay | Admitting: Family

## 2020-03-13 DIAGNOSIS — F419 Anxiety disorder, unspecified: Secondary | ICD-10-CM

## 2020-03-13 MED ORDER — CLONAZEPAM 0.5 MG PO TABS: 0.5000 mg | ORAL_TABLET | Freq: Two times a day (BID) | ORAL | 1 refills | Status: DC | PRN

## 2020-03-13 NOTE — Telephone Encounter (Signed)
pt needs a refill on clonazePAM (KLONOPIN) 0.5 MG tablet sent to [facility]

## 2020-03-13 NOTE — Telephone Encounter (Signed)
Patient called & made aware to request refills through pharmacy.

## 2020-03-13 NOTE — Telephone Encounter (Signed)
Call pt I refilled klonopin   Advise requests for controlled substance through pharmacy or mychart refill request ONE week in advance so no delays in prescription. If patient call office  for refill there can be delays due to call volume and doesn't go to refill inbox.  Please advise that going forward she needs to call pharmacy or req through mychart  I looked up patient on [place] Controlled Substances Reporting System PMP AWARE and saw no activity that raised concern of inappropriate use.

## 2020-03-15 LAB — CYTOLOGY - PAP
Comment: NEGATIVE
Diagnosis: NEGATIVE
High risk HPV: NEGATIVE

## 2020-03-17 ENCOUNTER — Other Ambulatory Visit: Payer: Self-pay

## 2020-03-17 ENCOUNTER — Other Ambulatory Visit (INDEPENDENT_AMBULATORY_CARE_PROVIDER_SITE_OTHER): Payer: 59

## 2020-03-17 DIAGNOSIS — R899 Unspecified abnormal finding in specimens from other organs, systems and tissues: Secondary | ICD-10-CM | POA: Diagnosis not present

## 2020-03-17 LAB — BASIC METABOLIC PANEL
BUN: 16 mg/dL (ref 6–23)
CO2: 31 mEq/L (ref 19–32)
Calcium: 9.8 mg/dL (ref 8.4–10.5)
Chloride: 98 mEq/L (ref 96–112)
Creatinine, Ser: 0.8 mg/dL (ref 0.40–1.20)
GFR: 88.92 mL/min (ref >60.00)
Glucose, Bld: 87 mg/dL (ref 70–99)
Potassium: 3.9 mEq/L (ref 3.5–5.1)
Sodium: 136 mEq/L (ref 135–145)

## 2020-03-20 ENCOUNTER — Other Ambulatory Visit: Payer: 59

## 2020-03-23 ENCOUNTER — Telehealth: Payer: 59

## 2020-03-23 ENCOUNTER — Telehealth: Payer: Self-pay

## 2020-03-23 DIAGNOSIS — Z1211 Encounter for screening for malignant neoplasm of colon: Secondary | ICD-10-CM

## 2020-03-23 NOTE — Telephone Encounter (Signed)
Gastroenterology Pre-Procedure Review  Request Date: Pt to call back to schedule. Requesting Physician: Dr. Marius Ditch per patient request.  Has seen Dr. Vicente Males in the past (09/22/17) for rectal bleed.  Prefers a female for colonoscopy.  PATIENT REVIEW QUESTIONS: The patient responded to the following health history questions as indicated:    1. Are you having any GI issues? yes (hemorrhoids) 2. Do you have a personal history of Polyps? no 3. Do you have a family history of Colon Cancer or Polyps? no 4. Diabetes Mellitus? no 5. Joint replacements in the past 12 months?no 6. Major health problems in the past 3 months?no 7. Any artificial heart valves, MVP, or defibrillator?no    MEDICATIONS & ALLERGIES:    Patient reports the following regarding taking any anticoagulation/antiplatelet therapy:   Plavix, Coumadin, Eliquis, Xarelto, Lovenox, Pradaxa, Brilinta, or Effient? no Aspirin? no  Patient confirms/reports the following medications:  Current Outpatient Medications  Medication Sig Dispense Refill  . Ascorbic Acid (VITAMIN C) 1000 MG tablet Take 1,000 mg by mouth daily.    . Cholecalciferol (VITAMIN D3) 50 MCG (2000 UT) TABS Take 1 tablet by mouth 3 (three) times a week.    . clonazePAM (KLONOPIN) 0.5 MG tablet Take 1 tablet (0.5 mg total) by mouth 2 (two) times daily as needed for anxiety. 60 tablet 1  . omega-3 fish oil (MAXEPA) 1000 MG CAPS capsule Take 1 capsule by mouth 2 (two) times daily.     No current facility-administered medications for this visit.    Patient confirms/reports the following allergies:  No Known Allergies  No orders of the defined types were placed in this encounter.   AUTHORIZATION INFORMATION Primary Insurance: 1D#: Group #:  Secondary Insurance: 1D#: Group #:  SCHEDULE INFORMATION: Date: To be determined. Time: Location:Pt prefers [place]

## 2020-03-24 ENCOUNTER — Encounter: Payer: Self-pay | Admitting: Family

## 2020-04-12 ENCOUNTER — Telehealth: Payer: Self-pay | Admitting: Family

## 2020-04-12 ENCOUNTER — Encounter: Payer: Self-pay | Admitting: Family

## 2020-04-12 ENCOUNTER — Other Ambulatory Visit: Payer: Self-pay

## 2020-04-12 ENCOUNTER — Ambulatory Visit (INDEPENDENT_AMBULATORY_CARE_PROVIDER_SITE_OTHER): Payer: 59 | Admitting: Family

## 2020-04-12 DIAGNOSIS — Z1211 Encounter for screening for malignant neoplasm of colon: Secondary | ICD-10-CM | POA: Diagnosis not present

## 2020-04-12 DIAGNOSIS — I10 Essential (primary) hypertension: Secondary | ICD-10-CM | POA: Diagnosis not present

## 2020-04-12 MED ORDER — AMLODIPINE BESYLATE 2.5 MG PO TABS: 2.5000 mg | ORAL_TABLET | Freq: Every day | ORAL | 3 refills | Status: DC

## 2020-04-12 NOTE — Assessment & Plan Note (Signed)
Ordered cologuard. Counseled on if positive she will need diagnostic colonoscopy and will likely pay more for this per insurance. She would like to proceed with cologuard and Elpidio Galea cma has ordered today

## 2020-04-12 NOTE — Assessment & Plan Note (Addendum)
New. Discussed weight loss at length and options to discuss amlodipine in the future. She will start amlodipine. Close follow up.

## 2020-04-12 NOTE — Progress Notes (Signed)
Subjective:    Patient ID: Sheryl Morrow, female    DOB: [birth year], 45 y.o.   MRN: [id number]  CC: Sheryl Morrow is a 45 y.o. female who presents today for follow up.   HPI: Follow up blood pressure She has been working on loosing weight, eating  healthier.   No cp,sob.  She would like cologuard at this time . no family h/o colon cancer.   HISTORY:  Past Medical History:  Diagnosis Date  . Hemorrhoids    Past Surgical History:  Procedure Laterality Date  . APPENDECTOMY    . CESAREAN SECTION  09/2008  . CYSTOSCOPY WITH URETHRAL DILATATION  2008  . DILATION AND CURETTAGE OF UTERUS     twice in 2007  . GALLBLADDER SURGERY     2007  . HERNIA REPAIR     embilical  . UMBILICAL HERNIA REPAIR  12/2007   Family History  Problem Relation Age of Onset  . Heart disease Father        Bypass  . Hypertension Father   . Diabetes Paternal Grandmother   . High Cholesterol Mother   . Breast cancer Neg Hx   . Colon cancer Neg Hx     Allergies: Patient has no known allergies. Current Outpatient Medications on File Prior to Visit  Medication Sig Dispense Refill  . Ascorbic Acid (VITAMIN C) 1000 MG tablet Take 1,000 mg by mouth daily.    . Cholecalciferol (VITAMIN D3) 50 MCG (2000 UT) TABS Take 1 tablet by mouth 3 (three) times a week.    . clonazePAM (KLONOPIN) 0.5 MG tablet Take 1 tablet (0.5 mg total) by mouth 2 (two) times daily as needed for anxiety. 60 tablet 1  . omega-3 fish oil (MAXEPA) 1000 MG CAPS capsule Take 1 capsule by mouth 2 (two) times daily.     No current facility-administered medications on file prior to visit.    Social History   Tobacco Use  . Smoking status: Former Smoker    Types: Cigarettes  . Smokeless tobacco: Never Used  Substance Use Topics  . Alcohol use: Yes    Alcohol/week: 0.0 standard drinks    Comment: Moderate use  . Drug use: No    Review of Systems  Constitutional: Negative for chills and fever.  Respiratory: Negative  for cough.   Cardiovascular: Negative for chest pain and palpitations.  Gastrointestinal: Negative for nausea and vomiting.      Objective:    BP 132/84   Pulse 88   Temp 97.8 F (36.6 C) (Oral)   Ht 5' 7"  (1.702 m)   Wt 212 lb 3.2 oz (96.3 kg)   SpO2 97%   BMI 33.24 kg/m  BP Readings from Last 3 Encounters:  04/12/20 132/84  03/09/20 130/84  09/22/17 (!) 146/94   Wt Readings from Last 3 Encounters:  04/12/20 212 lb 3.2 oz (96.3 kg)  03/09/20 214 lb (97.1 kg)  11/29/19 200 lb (90.7 kg)    Physical Exam Vitals reviewed.  Constitutional:      Appearance: She is well-developed and well-nourished.  Eyes:     Conjunctiva/sclera: Conjunctivae normal.  Cardiovascular:     Rate and Rhythm: Normal rate and regular rhythm.     Pulses: Normal pulses.     Heart sounds: Normal heart sounds.  Pulmonary:     Effort: Pulmonary effort is normal.     Breath sounds: Normal breath sounds. No wheezing, rhonchi or rales.  Skin:    General: Skin is  warm and dry.  Neurological:     Mental Status: She is alert.  Psychiatric:        Mood and Affect: Mood and affect normal.        Speech: Speech normal.        Behavior: Behavior normal.        Thought Content: Thought content normal.        Assessment & Plan:   Problem List Items Addressed This Visit      Cardiovascular and Mediastinum   HTN (hypertension)    New. Discussed weight loss at length and options to discuss amlodipine in the future. She will start amlodipine. Close follow up.       Relevant Medications   amLODipine (NORVASC) 2.5 MG tablet     Other   Screen for colon cancer    Ordered cologuard. Counseled on if positive she will need diagnostic colonoscopy and will likely pay more for this per insurance. She would like to proceed with cologuard and Elpidio Galea cma has ordered today      Relevant Orders   Cologuard       I am having Sheryl Morrow start on amLODipine. I am also having her maintain her  vitamin C, Vitamin D3, omega-3 fish oil, and clonazePAM.   Meds ordered this encounter  Medications  . amLODipine (NORVASC) 2.5 MG tablet    Sig: Take 1 tablet (2.5 mg total) by mouth at bedtime.    Dispense:  90 tablet    Refill:  3    Order Specific Question:   Supervising Provider    Answer:   Crecencio Mc [[id number]]    Return precautions given.   Risks, benefits, and alternatives of the medications and treatment plan prescribed today were discussed, and patient expressed understanding.   Education regarding symptom management and diagnosis given to patient on AVS.  Continue to follow with Burnard Hawthorne, FNP for routine health maintenance.   Sheryl Morrow and I agreed with plan.   Mable Paris, FNP

## 2020-04-12 NOTE — Patient Instructions (Addendum)
We have ordered cologuard for you today Please call us if you do not receive a kit in the next 2-3 weeks.   Start amlodipine 2.69m   Monitor blood pressure at home and me 5-6 reading on separate days. Goal is less than 120/80, based on newest guidelines, however we certainly want to be less than 130/80;  if persistently higher, please make sooner follow up appointment so we can recheck you blood pressure and manage/ adjust medications.  Nice to see you!   Managing Your Hypertension Hypertension, also called high blood pressure, is when the force of the blood pressing against the walls of the arteries is too strong. Arteries are blood vessels that carry blood from your heart throughout your body. Hypertension forces the heart to work harder to pump blood and may cause the arteries to become narrow or stiff. Understanding blood pressure readings Your personal target blood pressure may vary depending on your medical conditions, your age, and other factors. A blood pressure reading includes a higher number over a lower number. Ideally, your blood pressure should be below 120/80. You should know that:  The first, or top, number is called the systolic pressure. It is a measure of the pressure in your arteries as your heart beats.  The second, or bottom number, is called the diastolic pressure. It is a measure of the pressure in your arteries as the heart relaxes. Blood pressure is classified into four stages. Based on your blood pressure reading, your health care provider may use the following stages to determine what type of treatment you need, if any. Systolic pressure and diastolic pressure are measured in a unit called mmHg. Normal  Systolic pressure: below 1875  Diastolic pressure: below 80. Elevated  Systolic pressure: 1643-329  Diastolic pressure: below 80. Hypertension stage 1  Systolic pressure: 1518-841  Diastolic pressure: 866-06 Hypertension stage 2  Systolic pressure: 1301 or above.  Diastolic pressure: 90 or above. How can this condition affect me? Managing your hypertension is an important responsibility. Over time, hypertension can damage the arteries and decrease blood flow to important parts of the body, including the brain, heart, and kidneys. Having untreated or uncontrolled hypertension can lead to:  A heart attack.  A stroke.  A weakened blood vessel (aneurysm).  Heart failure.  Kidney damage.  Eye damage.  Metabolic syndrome.  Memory and concentration problems.  Vascular dementia. What actions can I take to manage this condition? Hypertension can be managed by making lifestyle changes and possibly by taking medicines. Your health care provider will help you make a plan to bring your blood pressure within a normal range. Nutrition  Eat a diet that is high in fiber and potassium, and low in salt (sodium), added sugar, and fat. An example eating plan is called the Dietary Approaches to Stop Hypertension (DASH) diet. To eat this way: ? Eat plenty of fresh fruits and vegetables. Try to fill one-half of your plate at each meal with fruits and vegetables. ? Eat whole grains, such as whole-wheat pasta, brown rice, or whole-grain bread. Fill about one-fourth of your plate with whole grains. ? Eat low-fat dairy products. ? Avoid fatty cuts of meat, processed or cured meats, and poultry with skin. Fill about one-fourth of your plate with lean proteins such as fish, chicken without skin, beans, eggs, and tofu. ? Avoid pre-made and processed foods. These tend to be higher in sodium, added sugar, and fat.  Reduce your daily sodium intake. Most people with hypertension should  eat less than 1,500 mg of sodium a day.   Lifestyle  Work with your health care provider to maintain a healthy body weight or to lose weight. Ask what an ideal weight is for you.  Get at least 30 minutes of exercise that causes your heart to beat faster (aerobic exercise) most  days of the week. Activities may include walking, swimming, or biking.  Include exercise to strengthen your muscles (resistance exercise), such as weight lifting, as part of your weekly exercise routine. Try to do these types of exercises for 30 minutes at least 3 days a week.  Do not use any products that contain nicotine or tobacco, such as cigarettes, e-cigarettes, and chewing tobacco. If you need help quitting, ask your health care provider.  Control any long-term (chronic) conditions you have, such as high cholesterol or diabetes.  Identify your sources of stress and find ways to manage stress. This may include meditation, deep breathing, or making time for fun activities.   Alcohol use  Do not drink alcohol if: ? Your health care provider tells you not to drink. ? You are pregnant, may be pregnant, or are planning to become pregnant.  If you drink alcohol: ? Limit how much you use to:  0-1 drink a day for women.  0-2 drinks a day for men. ? Be aware of how much alcohol is in your drink. In the [place] one drink equals one 12 oz bottle of beer (355 mL), one 5 oz glass of wine (148 mL), or one 1 oz glass of hard liquor (44 mL). Medicines Your health care provider may prescribe medicine if lifestyle changes are not enough to get your blood pressure under control and if:  Your systolic blood pressure is 130 or higher.  Your diastolic blood pressure is 80 or higher. Take medicines only as told by your health care provider. Follow the directions carefully. Blood pressure medicines must be taken as told by your health care provider. The medicine does not work as well when you skip doses. Skipping doses also puts you at risk for problems. Monitoring Before you monitor your blood pressure:  Do not smoke, drink caffeinated beverages, or exercise within 30 minutes before taking a measurement.  Use the bathroom and empty your bladder (urinate).  Sit quietly for at least 5 minutes before  taking measurements. Monitor your blood pressure at home as told by your health care provider. To do this:  Sit with your back straight and supported.  Place your feet flat on the floor. Do not cross your legs.  Support your arm on a flat surface, such as a table. Make sure your upper arm is at heart level.  Each time you measure, take two or three readings one minute apart and record the results. You may also need to have your blood pressure checked regularly by your health care provider.   General information  Talk with your health care provider about your diet, exercise habits, and other lifestyle factors that may be contributing to hypertension.  Review all the medicines you take with your health care provider because there may be side effects or interactions.  Keep all visits as told by your health care provider. Your health care provider can help you create and adjust your plan for managing your high blood pressure. Where to find more information  National Heart, Lung, and Blood Institute: https://wilson-eaton.com/  American Heart Association: www.heart.org Contact a health care provider if:  You think you are having a  reaction to medicines you have taken.  You have repeated (recurrent) headaches.  You feel dizzy.  You have swelling in your ankles.  You have trouble with your vision. Get help right away if:  You develop a severe headache or confusion.  You have unusual weakness or numbness, or you feel faint.  You have severe pain in your chest or abdomen.  You vomit repeatedly.  You have trouble breathing. These symptoms may represent a serious problem that is an emergency. Do not wait to see if the symptoms will go away. Get medical help right away. Call your local emergency services (911 in the [place] Do not drive yourself to the hospital. Summary  Hypertension is when the force of blood pumping through your arteries is too strong. If this condition is not controlled,  it may put you at risk for serious complications.  Your personal target blood pressure may vary depending on your medical conditions, your age, and other factors. For most people, a normal blood pressure is less than 120/80.  Hypertension is managed by lifestyle changes, medicines, or both.  Lifestyle changes to help manage hypertension include losing weight, eating a healthy, low-sodium diet, exercising more, stopping smoking, and limiting alcohol. This information is not intended to replace advice given to you by your health care provider. Make sure you discuss any questions you have with your health care provider. Document Revised: 03/26/2019 Document Reviewed: 01/19/2019 Elsevier Patient Education  2021 [employer].

## 2020-04-12 NOTE — Telephone Encounter (Signed)
close

## 2020-05-03 ENCOUNTER — Ambulatory Visit
Admission: RE | Admit: 2020-05-03 | Discharge: 2020-05-03 | Disposition: A | Discharge status: Home / Self Care | Payer: 59 | Source: Ambulatory Visit | Attending: Family | Admitting: Family

## 2020-05-03 ENCOUNTER — Other Ambulatory Visit: Payer: Self-pay

## 2020-05-03 DIAGNOSIS — Z1231 Encounter for screening mammogram for malignant neoplasm of breast: Secondary | ICD-10-CM | POA: Diagnosis not present

## 2020-05-03 DIAGNOSIS — Z0000 Encounter for general adult medical examination without abnormal findings: Secondary | ICD-10-CM

## 2020-05-08 ENCOUNTER — Telehealth: Payer: Self-pay

## 2020-05-08 ENCOUNTER — Other Ambulatory Visit: Payer: Self-pay | Admitting: Family

## 2020-05-08 ENCOUNTER — Telehealth: Payer: Self-pay | Admitting: Family

## 2020-05-08 DIAGNOSIS — N6320 Unspecified lump in the left breast, unspecified quadrant: Secondary | ICD-10-CM

## 2020-05-08 DIAGNOSIS — R928 Other abnormal and inconclusive findings on diagnostic imaging of breast: Secondary | ICD-10-CM

## 2020-05-08 NOTE — Telephone Encounter (Signed)
LMTCB for mammogram results.

## 2020-05-08 NOTE — Telephone Encounter (Signed)
Call pt She has never returned cologuard Please remind her to do so

## 2020-05-09 NOTE — Telephone Encounter (Signed)
LMTCB

## 2020-05-17 ENCOUNTER — Ambulatory Visit
Admission: RE | Admit: 2020-05-17 | Discharge: 2020-05-17 | Disposition: A | Discharge status: Home / Self Care | Payer: 59 | Source: Ambulatory Visit | Attending: Family | Admitting: Family

## 2020-05-17 ENCOUNTER — Other Ambulatory Visit: Payer: Self-pay

## 2020-05-17 DIAGNOSIS — R928 Other abnormal and inconclusive findings on diagnostic imaging of breast: Secondary | ICD-10-CM | POA: Insufficient documentation

## 2020-05-17 DIAGNOSIS — N6320 Unspecified lump in the left breast, unspecified quadrant: Secondary | ICD-10-CM | POA: Insufficient documentation

## 2020-05-23 NOTE — Telephone Encounter (Signed)
Please circle back

## 2020-05-24 NOTE — Telephone Encounter (Signed)
I called patient & she stated that she never received. I have ordered for patient through the portal.

## 2020-05-24 NOTE — Telephone Encounter (Signed)
Noted Thank you for ordering again

## 2020-06-12 ENCOUNTER — Encounter: Payer: Self-pay | Admitting: Family

## 2020-06-12 ENCOUNTER — Ambulatory Visit (INDEPENDENT_AMBULATORY_CARE_PROVIDER_SITE_OTHER): Payer: 59 | Admitting: Family

## 2020-06-12 ENCOUNTER — Other Ambulatory Visit: Payer: Self-pay

## 2020-06-12 DIAGNOSIS — Z1211 Encounter for screening for malignant neoplasm of colon: Secondary | ICD-10-CM | POA: Diagnosis not present

## 2020-06-12 DIAGNOSIS — I10 Essential (primary) hypertension: Secondary | ICD-10-CM

## 2020-06-12 NOTE — Progress Notes (Signed)
Subjective:    Patient ID: Sheryl Morrow, female    DOB: 02-02-1976, 45 y.o.   MRN: [id number]  CC: Sheryl Morrow is a 45 y.o. female who presents today for follow up.   HPI: She is working on weight loss and total 8 lb weight loss.  She hasnt started amlodipine and she has been working diligent in regards to weight loss. No cp.   Using klonopin half tablet once to twice per week for anxiety and to help with sleep.   No depression.   Cololguard is not complete and she has called Cologuard and told her kit is on the way.     HISTORY:  Past Medical History:  Diagnosis Date  . Hemorrhoids    Past Surgical History:  Procedure Laterality Date  . APPENDECTOMY    . CESAREAN SECTION  09/2008  . CYSTOSCOPY WITH URETHRAL DILATATION  2008  . DILATION AND CURETTAGE OF UTERUS     twice in 2007  . GALLBLADDER SURGERY     2007  . HERNIA REPAIR     embilical  . UMBILICAL HERNIA REPAIR  12/2007   Family History  Problem Relation Age of Onset  . Heart disease Father        Bypass  . Hypertension Father   . Diabetes Paternal Grandmother   . High Cholesterol Mother   . Breast cancer Neg Hx   . Colon cancer Neg Hx     Allergies: Patient has no known allergies. Current Outpatient Medications on File Prior to Visit  Medication Sig Dispense Refill  . Ascorbic Acid (VITAMIN C) 1000 MG tablet Take 1,000 mg by mouth daily.    Marland Kitchen CALCIUM-MAGNESIUM-ZINC PO Take 1 tablet by mouth daily.    . Cholecalciferol (VITAMIN D3) 50 MCG (2000 UT) TABS Take 1 tablet by mouth 3 (three) times a week.    . clonazePAM (KLONOPIN) 0.5 MG tablet Take 1 tablet (0.5 mg total) by mouth 2 (two) times daily as needed for anxiety. 60 tablet 1  . omega-3 fish oil (MAXEPA) 1000 MG CAPS capsule Take 1 capsule by mouth 2 (two) times daily.    Marland Kitchen amLODipine (NORVASC) 2.5 MG tablet Take 1 tablet (2.5 mg total) by mouth at bedtime. (Patient not taking: Reported on 06/12/2020) 90 tablet 3   No current  facility-administered medications on file prior to visit.    Social History   Tobacco Use  . Smoking status: Former Smoker    Types: Cigarettes  . Smokeless tobacco: Never Used  Substance Use Topics  . Alcohol use: Yes    Alcohol/week: 0.0 standard drinks    Comment: Moderate use  . Drug use: No    Review of Systems  Constitutional: Negative for chills and fever.  Respiratory: Negative for cough.   Cardiovascular: Negative for chest pain and palpitations.  Gastrointestinal: Negative for nausea and vomiting.      Objective:    BP 128/90   Pulse 71   Temp 97.9 F (36.6 C)   Ht 5' 7"  (1.702 m)   Wt 208 lb 6.4 oz (94.5 kg)   SpO2 98%   BMI 32.64 kg/m  BP Readings from Last 3 Encounters:  06/12/20 128/90  04/12/20 132/84  03/09/20 130/84   Wt Readings from Last 3 Encounters:  06/12/20 208 lb 6.4 oz (94.5 kg)  04/12/20 212 lb 3.2 oz (96.3 kg)  03/09/20 214 lb (97.1 kg)    Physical Exam Vitals reviewed.  Constitutional:  Appearance: She is well-developed.  Eyes:     Conjunctiva/sclera: Conjunctivae normal.  Cardiovascular:     Rate and Rhythm: Normal rate and regular rhythm.     Pulses: Normal pulses.     Heart sounds: Normal heart sounds.  Pulmonary:     Effort: Pulmonary effort is normal.     Breath sounds: Normal breath sounds. No wheezing, rhonchi or rales.  Skin:    General: Skin is warm and dry.  Neurological:     Mental Status: She is alert.  Psychiatric:        Speech: Speech normal.        Behavior: Behavior normal.        Thought Content: Thought content normal.        Assessment & Plan:   Problem List Items Addressed This Visit      Cardiovascular and Mediastinum   HTN (hypertension)    Slightly elevated. Counseled patient on risks of untreated HTN. She is very agreeable to starting amlodipine 2.67m        Other   Screen for colon cancer    Per patient cologuard kit is on the way.advised to call our office in 2 weeks time if  she has not received. She verbalized understanding.          I am having CCandyce Churnmaintain her vitamin C, Vitamin D3, omega-3 fish oil, clonazePAM, amLODipine, and CALCIUM-MAGNESIUM-ZINC PO.   No orders of the defined types were placed in this encounter.   Return precautions given.   Risks, benefits, and alternatives of the medications and treatment plan prescribed today were discussed, and patient expressed understanding.   Education regarding symptom management and diagnosis given to patient on AVS.  Continue to follow with ABurnard Hawthorne FNP for routine health maintenance.   CCandyce Churnand I agreed with plan.   MMable Paris FNP

## 2020-06-12 NOTE — Assessment & Plan Note (Signed)
Slightly elevated. Counseled patient on risks of untreated HTN. She is very agreeable to starting amlodipine 2.63m

## 2020-06-12 NOTE — Assessment & Plan Note (Signed)
Per patient cologuard kit is on the way.advised to call our office in 2 weeks time if she has not received. She verbalized understanding.

## 2020-06-20 LAB — COLOGUARD: Cologuard: NEGATIVE

## 2020-06-24 LAB — COLOGUARD: COLOGUARD: NEGATIVE

## 2020-06-24 LAB — EXTERNAL GENERIC LAB PROCEDURE: COLOGUARD: NEGATIVE

## 2020-06-26 ENCOUNTER — Telehealth: Payer: Self-pay

## 2020-06-26 NOTE — Telephone Encounter (Signed)
Negative Cologuard result.

## 2020-06-27 NOTE — Telephone Encounter (Signed)
Call pt     let  know Cologuard NEGATIVE which indicates lower likelihood of cancer or pre cancer. You will need to repeat test in 3 years or sooner if any symptoms including blood in stool, change in bowel habits.

## 2020-06-27 NOTE — Telephone Encounter (Signed)
Pt called and notified of results. Patient had no further questions.

## 2020-08-17 ENCOUNTER — Encounter: Payer: Self-pay | Admitting: Family

## 2020-08-18 ENCOUNTER — Telehealth: Payer: Self-pay

## 2020-08-18 NOTE — Telephone Encounter (Signed)
I called patient & asked her if shew wanted to come pick up completed form since she needed to sign. She will call us back to give [place] fax number to fax to her husband so she could sign.

## 2020-08-21 ENCOUNTER — Telehealth: Payer: Self-pay

## 2020-08-21 NOTE — Telephone Encounter (Signed)
Close  

## 2020-09-13 ENCOUNTER — Encounter: Payer: Self-pay | Admitting: Family

## 2020-09-13 ENCOUNTER — Ambulatory Visit (INDEPENDENT_AMBULATORY_CARE_PROVIDER_SITE_OTHER): Payer: 59 | Admitting: Family

## 2020-09-13 ENCOUNTER — Other Ambulatory Visit: Payer: Self-pay

## 2020-09-13 DIAGNOSIS — I10 Essential (primary) hypertension: Secondary | ICD-10-CM | POA: Diagnosis not present

## 2020-09-13 MED ORDER — AMLODIPINE BESYLATE 5 MG PO TABS: 5.0000 mg | ORAL_TABLET | Freq: Every day | ORAL | 1 refills | Status: DC

## 2020-09-13 NOTE — Assessment & Plan Note (Signed)
Uncontrolled. Discussed goal < 120/80. Increase amlodipine to 50m

## 2020-09-13 NOTE — Progress Notes (Signed)
Subjective:    Patient ID: Sheryl Morrow, female    DOB: [birth year], 45 y.o.   MRN: [id number]  CC: Sheryl Morrow is a 45 y.o. female who presents today for follow up.   HPI: Sheryl Morrow feels well today No new complaints  Sheryl Morrow is taking amlodipine 2.89m. At home BP , 131/90.  No cp, sob, dizziness.  Sheryl Morrow is continues to work on weight loss, remaining active and healthy diet. Sheryl Morrow has lost 6 lbs this year.      HISTORY:  Past Medical History:  Diagnosis Date   Hemorrhoids    Past Surgical History:  Procedure Laterality Date   APPENDECTOMY     CESAREAN SECTION  09/2008   CYSTOSCOPY WITH URETHRAL DILATATION  2008   DILATION AND CURETTAGE OF UTERUS     twice in 2007   GALLBLADDER SURGERY     2007   HERNIA REPAIR     embilical   UMBILICAL HERNIA REPAIR  12/2007   Family History  Problem Relation Age of Onset   Heart disease Father        Bypass   Hypertension Father    Diabetes Paternal Grandmother    High Cholesterol Mother    Breast cancer Neg Hx    Colon cancer Neg Hx     Allergies: Patient has no known allergies. Current Outpatient Medications on File Prior to Visit  Medication Sig Dispense Refill   Ascorbic Acid (VITAMIN C) 1000 MG tablet Take 1,000 mg by mouth daily.     CALCIUM-MAGNESIUM-ZINC PO Take 1 tablet by mouth daily.     Cholecalciferol (VITAMIN D3) 50 MCG (2000 UT) TABS Take 1 tablet by mouth 3 (three) times a week.     clonazePAM (KLONOPIN) 0.5 MG tablet Take 1 tablet (0.5 mg total) by mouth 2 (two) times daily as needed for anxiety. 60 tablet 1   omega-3 fish oil (MAXEPA) 1000 MG CAPS capsule Take 1 capsule by mouth 2 (two) times daily.     No current facility-administered medications on file prior to visit.    Social History   Tobacco Use   Smoking status: Former    Pack years: 0.00    Types: Cigarettes   Smokeless tobacco: Never  Substance Use Topics   Alcohol use: Yes    Alcohol/week: 0.0 standard drinks    Comment: Moderate use    Drug use: No    Review of Systems  Constitutional:  Negative for chills and fever.  Respiratory:  Negative for cough.   Cardiovascular:  Negative for chest pain and palpitations.  Gastrointestinal:  Negative for nausea and vomiting.     Objective:    BP 130/84 (BP Location: Left Arm, Patient Position: Sitting, Cuff Size: Large)   Pulse 82   Temp 97.7 F (36.5 C) (Oral)   Ht 5' 7"  (1.702 m)   Wt 206 lb 3.2 oz (93.5 kg)   SpO2 99%   BMI 32.30 kg/m  BP Readings from Last 3 Encounters:  09/13/20 130/84  06/12/20 128/90  04/12/20 132/84   Wt Readings from Last 3 Encounters:  09/13/20 206 lb 3.2 oz (93.5 kg)  06/12/20 208 lb 6.4 oz (94.5 kg)  04/12/20 212 lb 3.2 oz (96.3 kg)    Physical Exam Vitals reviewed.  Constitutional:      Appearance: Sheryl Morrow is well-developed.  Eyes:     Conjunctiva/sclera: Conjunctivae normal.  Cardiovascular:     Rate and Rhythm: Normal rate and regular rhythm.  Pulses: Normal pulses.     Heart sounds: Normal heart sounds.  Pulmonary:     Effort: Pulmonary effort is normal.     Breath sounds: Normal breath sounds. No wheezing, rhonchi or rales.  Skin:    General: Skin is warm and dry.  Neurological:     Mental Status: Sheryl Morrow.  Psychiatric:        Speech: Speech normal.        Behavior: Behavior normal.        Thought Content: Thought content normal.       Assessment & Plan:   Problem List Items Addressed This Visit       Cardiovascular and Mediastinum   HTN (hypertension)    Uncontrolled. Discussed goal < 120/80. Increase amlodipine to 46m        Relevant Medications   amLODipine (NORVASC) 5 MG tablet     I have changed CBarnetta ChapelH. Fretz's amLODipine. I am also having Sheryl Morrow maintain Sheryl Morrow vitamin C, Vitamin D3, omega-3 fish oil, clonazePAM, and CALCIUM-MAGNESIUM-ZINC PO.   Meds ordered this encounter  Medications   amLODipine (NORVASC) 5 MG tablet    Sig: Take 1 tablet (5 mg total) by mouth at bedtime.     Dispense:  90 tablet    Refill:  1    Order Specific Question:   Supervising Provider    Answer:   TCrecencio Mc[[id number]]     Return precautions given.   Risks, benefits, and alternatives of the medications and treatment plan prescribed today were discussed, and patient expressed understanding.   Education regarding symptom management and diagnosis given to patient on AVS.  Continue to follow with ABurnard Hawthorne FNP for routine health maintenance.   CCandyce Churnand I agreed with plan.   MMable Paris FNP

## 2020-10-10 ENCOUNTER — Other Ambulatory Visit: Payer: Self-pay | Admitting: Family

## 2020-10-10 DIAGNOSIS — F419 Anxiety disorder, unspecified: Secondary | ICD-10-CM

## 2020-10-13 ENCOUNTER — Other Ambulatory Visit: Payer: Self-pay

## 2020-10-13 DIAGNOSIS — F419 Anxiety disorder, unspecified: Secondary | ICD-10-CM

## 2020-10-13 NOTE — Telephone Encounter (Signed)
Is she going to run out before Joycelyn Schmid is back in the office?

## 2020-10-13 NOTE — Telephone Encounter (Signed)
It looks like she uses it sparingly.  Last refill was a number of months ago.  I will defer refilling this to [facility].

## 2020-10-20 ENCOUNTER — Telehealth: Payer: Self-pay

## 2020-10-20 DIAGNOSIS — F419 Anxiety disorder, unspecified: Secondary | ICD-10-CM

## 2020-10-20 MED ORDER — CLONAZEPAM 0.5 MG PO TABS
0.5000 mg | ORAL_TABLET | Freq: Two times a day (BID) | ORAL | 0 refills | Status: DC | PRN
Start: 2020-10-20 — End: 2021-02-05

## 2020-10-20 NOTE — Telephone Encounter (Signed)
Pt states that [facility] is telling her they do not have her rx of clonazePAM (KLONOPIN) 0.5 MG tablet. Pt needs ASAp-please resend or call in

## 2020-10-20 NOTE — Telephone Encounter (Signed)
Left message for patient to return call back. Also spoken with pharmacy and they did receive the 14 tablets. They are filling it with a discount card. Because patient insurance is stating medication needs to be sent to mail order. I did tell the pharmacy that we do not send controls to mail order pharmacy.

## 2020-10-20 NOTE — Addendum Note (Signed)
Addended by: Leone Haven on: 10/20/2020 04:52 PM   Modules accepted: Orders

## 2020-10-20 NOTE — Telephone Encounter (Signed)
I sent a short-term refill in for the patient.  I will forward to her PCP to resend a longer-term refill.  I did speak with Fransisco Beau and he spoke with pharmacy and they did not receive the initial refill from the patient's PCP.

## 2020-10-23 NOTE — Telephone Encounter (Signed)
Left message for patient to return call back.  

## 2020-10-23 NOTE — Telephone Encounter (Signed)
Call pt I known sonnenberg refilled klonopin 14 tabs Approx how often does she use?   I would like to update rx  Advise to always call 4 days ahead of needing medication   She may call us when she has one-two weeks left of medication and I will refill at that time

## 2020-10-27 NOTE — Telephone Encounter (Signed)
LMTCB

## 2020-11-02 NOTE — Telephone Encounter (Signed)
LMTCB

## 2020-11-13 NOTE — Telephone Encounter (Signed)
Mychart message sent.

## 2020-12-15 ENCOUNTER — Ambulatory Visit (INDEPENDENT_AMBULATORY_CARE_PROVIDER_SITE_OTHER): Payer: 59 | Admitting: Family

## 2020-12-15 ENCOUNTER — Ambulatory Visit (INDEPENDENT_AMBULATORY_CARE_PROVIDER_SITE_OTHER): Payer: 59

## 2020-12-15 ENCOUNTER — Other Ambulatory Visit: Payer: Self-pay

## 2020-12-15 ENCOUNTER — Encounter: Payer: Self-pay | Admitting: Family

## 2020-12-15 VITALS — BP 122/80 | HR 93 | Temp 97.8°F | Ht 67.0 in | Wt 202.6 lb

## 2020-12-15 DIAGNOSIS — M545 Low back pain, unspecified: Secondary | ICD-10-CM

## 2020-12-15 DIAGNOSIS — I10 Essential (primary) hypertension: Secondary | ICD-10-CM | POA: Diagnosis not present

## 2020-12-15 DIAGNOSIS — R21 Rash and other nonspecific skin eruption: Secondary | ICD-10-CM

## 2020-12-15 NOTE — Patient Instructions (Signed)
Referral dermatology and also to physical therapy  Let us know if you dont hear back within a week in regards to an appointment being scheduled.

## 2020-12-15 NOTE — Assessment & Plan Note (Signed)
Excellent control.  Continue amlodipine 58m

## 2020-12-15 NOTE — Progress Notes (Signed)
Subjective:    Patient ID: Sheryl Morrow, female    DOB: 1975-11-05, 45 y.o.   MRN: [id number]  CC: Sheryl Morrow is a 45 y.o. female who presents today for follow up.   HPI: Complains of low mid back pain, x  'for years'  , more noticeable in the morning. Stiffness.No rash, fever.   Improves with movement. No numbness, pain in groin, saddles anesthesia.   No falls.  No pain today Has tried low back stretching with temporary relief.   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    No h/o cancer.   HTN- compliant with amlodipine 5 mg  She also like a referral to dermatology for skin exam, and for reoccurring rash on her eyelids, under her eyes.  Scaly in appearance.  No new facial lotions.  Rashes not bleeding .  No history of skin cancer.  No rash today.  HISTORY:  Past Medical History:  Diagnosis Date   Hemorrhoids    Past Surgical History:  Procedure Laterality Date   APPENDECTOMY     CESAREAN SECTION  09/2008   CYSTOSCOPY WITH URETHRAL DILATATION  2008   DILATION AND CURETTAGE OF UTERUS     twice in 2007   GALLBLADDER SURGERY     2007   HERNIA REPAIR     embilical   UMBILICAL HERNIA REPAIR  12/2007   Family History  Problem Relation Age of Onset   Heart disease Father        Bypass   Hypertension Father    Diabetes Paternal Grandmother    High Cholesterol Mother    Breast cancer Neg Hx    Colon cancer Neg Hx     Allergies: Patient has no known allergies. Current Outpatient Medications on File Prior to Visit  Medication Sig Dispense Refill   amLODipine (NORVASC) 5 MG tablet Take 1 tablet (5 mg total) by mouth at bedtime. 90 tablet 1   Ascorbic Acid (VITAMIN C) 1000 MG tablet Take 1,000 mg by mouth daily.     CALCIUM-MAGNESIUM-ZINC PO Take 1 tablet by mouth daily.     Cholecalciferol (VITAMIN D3) 50 MCG (2000 UT) TABS Take 1 tablet by mouth 3 (three) times a week.     clonazePAM  (KLONOPIN) 0.5 MG tablet Take 1 tablet (0.5 mg total) by mouth 2 (two) times daily as needed for anxiety. 14 tablet 0   omega-3 fish oil (MAXEPA) 1000 MG CAPS capsule Take 1 capsule by mouth 2 (two) times daily.     No current facility-administered medications on file prior to visit.    Social History   Tobacco Use   Smoking status: Former    Types: Cigarettes   Smokeless tobacco: Never  Substance Use Topics   Alcohol use: Yes    Alcohol/week: 0.0 standard drinks    Comment: Moderate use   Drug use: No    Review of Systems  Constitutional:  Negative for chills and fever.  Respiratory:  Negative for cough.   Cardiovascular:  Negative for chest pain and palpitations.  Gastrointestinal:  Negative for nausea and vomiting.  Genitourinary:  Negative for difficulty urinating.  Musculoskeletal:  Positive for back pain.  Neurological:  Negative for numbness.     Objective:    BP 122/80 (BP Location: Left Arm, Patient Position: Sitting, Cuff Size: Large)   Pulse 93   Temp 97.8 F (36.6 C) (Oral)   Ht 5'  7" (1.702 m)   Wt 202 lb 9.6 oz (91.9 kg)   SpO2 99%   BMI 31.73 kg/m  BP Readings from Last 3 Encounters:  12/15/20 122/80  09/13/20 130/84  06/12/20 128/90   Wt Readings from Last 3 Encounters:  12/15/20 202 lb 9.6 oz (91.9 kg)  09/13/20 206 lb 3.2 oz (93.5 kg)  06/12/20 208 lb 6.4 oz (94.5 kg)    Physical Exam Vitals reviewed.  Constitutional:      Appearance: She is well-developed.  Eyes:     Conjunctiva/sclera: Conjunctivae normal.  Cardiovascular:     Rate and Rhythm: Normal rate and regular rhythm.     Pulses: Normal pulses.     Heart sounds: Normal heart sounds.  Pulmonary:     Effort: Pulmonary effort is normal.     Breath sounds: Normal breath sounds. No wheezing, rhonchi or rales.  Musculoskeletal:     Lumbar back: No swelling, edema, spasms, tenderness or bony tenderness. Normal range of motion. Negative right straight leg raise test and negative left  straight leg raise test.     Comments: Full range of motion with flexion, tension, lateral side bends. No bony tenderness. No pain, numbness, tingling elicited with single leg raise bilaterally.   Right Hip: No limp or waddling gait. Full ROM with flexion and hip rotation in flexion.    No pain of lateral hip with  (flexion-abduction-external rotation) test.   Tenderness with deep palpation of right greater trochanter.     Skin:    General: Skin is warm and dry.  Neurological:     Mental Status: She is alert.     Sensory: No sensory deficit.     Deep Tendon Reflexes:     Reflex Scores:      Patellar reflexes are 2+ on the right side and 2+ on the left side.    Comments: Sensation and strength intact bilateral lower extremities.  Psychiatric:        Speech: Speech normal.        Behavior: Behavior normal.        Thought Content: Thought content normal.       Assessment & Plan:   Problem List Items Addressed This Visit       Cardiovascular and Mediastinum   HTN (hypertension)    Excellent control.  Continue amlodipine 67m        Other   Low back pain - Primary    Chronic, unchanged.  Pending x-ray lumbar and pelvis.  Evidence of trochanteric bursitis on the right side.  Advised heat, Salonpas pain patch, Biofreeze.  Referral to physical therapy.  She will let me know how she is doing      Relevant Orders   DG Lumbar Spine Complete   DG HIPS BILAT WITH PELVIS 2V   Ambulatory referral to Physical Therapy   Other Visit Diagnoses     Skin rash       Relevant Orders   Ambulatory referral to Dermatology        I am having CCandyce Churnmaintain her vitamin C, Vitamin D3, omega-3 fish oil, CALCIUM-MAGNESIUM-ZINC PO, amLODipine, and clonazePAM.   No orders of the defined types were placed in this encounter.   Return precautions given.   Risks, benefits, and alternatives of the medications and treatment plan prescribed today were discussed, and patient  expressed understanding.   Education regarding symptom management and diagnosis given to patient on AVS.  Continue to follow with ABurnard Hawthorne  FNP for routine health maintenance.   Sheryl Morrow and I agreed with plan.   Mable Paris, FNP

## 2020-12-15 NOTE — Assessment & Plan Note (Signed)
Chronic, unchanged.  Pending x-ray lumbar and pelvis.  Evidence of trochanteric bursitis on the right side.  Advised heat, Salonpas pain patch, Biofreeze.  Referral to physical therapy.  She will let me know how she is doing

## 2020-12-21 ENCOUNTER — Encounter: Payer: Self-pay | Admitting: Dermatology

## 2020-12-21 ENCOUNTER — Other Ambulatory Visit: Payer: Self-pay

## 2020-12-21 ENCOUNTER — Ambulatory Visit (INDEPENDENT_AMBULATORY_CARE_PROVIDER_SITE_OTHER): Payer: 59 | Admitting: Dermatology

## 2020-12-21 ENCOUNTER — Ambulatory Visit: Payer: 59 | Admitting: Dermatology

## 2020-12-21 DIAGNOSIS — L219 Seborrheic dermatitis, unspecified: Secondary | ICD-10-CM

## 2020-12-21 MED ORDER — PIMECROLIMUS 1 % EX CREA: TOPICAL_CREAM | Freq: Two times a day (BID) | CUTANEOUS | Status: DC

## 2020-12-21 NOTE — Patient Instructions (Signed)

## 2020-12-21 NOTE — Progress Notes (Signed)
   New Patient Visit  Subjective  Sheryl Morrow is a 45 y.o. female who presents for the following: Rash (Pt c/o rash on the eyelids x 5 months, red and sometimes will scab then go away and come back ). Pt report no muscle fatigue, weakness or sickness.   The following portions of the chart were reviewed this encounter and updated as appropriate:   Tobacco  Allergies  Meds  Problems  Med Hx  Surg Hx  Fam Hx     Review of Systems:  No other skin or systemic complaints except as noted in HPI or Assessment and Plan.  Objective  Well appearing patient in no apparent distress; mood and affect are within normal limits.  A focused examination was performed including face. Relevant physical exam findings are noted in the Assessment and Plan.  Head - Anterior (Face)        Assessment & Plan   Eyelid dermatitis  = Seborrheic dermatitis versus contact dermatitis Not consistent with heliotrope rash of dermatomyositis Head - Anterior (Face)  Seborrheic Dermatitis  -  is a chronic persistent rash characterized by pinkness and scaling most commonly of the mid face but also can occur on the scalp (dandruff), ears; mid chest, mid back and groin.  It tends to be exacerbated by stress and cooler weather.  People who have neurologic disease may experience new onset or exacerbation of existing seborrheic dermatitis.  The condition is not curable but treatable and can be controlled.   Start Elidel cream apply to eyelids once  a day May consider Protopic if insurance does not cover Elidel  May consider patch testing /True Test x36 in the future if no better  May consider biopsy in the future if indicated. Related Medications pimecrolimus (ELIDEL) 1 % cream Apply topically 2 (two) times daily.  Return in about 4 weeks (around 01/18/2021) for dermatits, possible patch testing .  IMarye Round, CMA, am acting as scribe for Sarina Ser, MD .  Documentation: I have reviewed the  above documentation for accuracy and completeness, and I agree with the above.  Sarina Ser, MD

## 2020-12-26 ENCOUNTER — Other Ambulatory Visit: Payer: Self-pay

## 2020-12-26 MED ORDER — TACROLIMUS 0.1 % EX OINT: TOPICAL_OINTMENT | Freq: Two times a day (BID) | CUTANEOUS | 1 refills | Status: DC

## 2020-12-26 NOTE — Progress Notes (Signed)
Elidel PA was approved but generic came back with a $194 copay. Tacrolimus sent in as alternative (OK per note). Patient advised to only purchase cheaper medication. She was also provided with [employer]. Coupons. aw

## 2021-01-01 ENCOUNTER — Ambulatory Visit: Payer: 59 | Attending: Family

## 2021-01-01 DIAGNOSIS — G8929 Other chronic pain: Secondary | ICD-10-CM | POA: Diagnosis present

## 2021-01-01 DIAGNOSIS — M6281 Muscle weakness (generalized): Secondary | ICD-10-CM | POA: Diagnosis present

## 2021-01-01 DIAGNOSIS — M545 Low back pain, unspecified: Secondary | ICD-10-CM | POA: Diagnosis not present

## 2021-01-01 DIAGNOSIS — M25551 Pain in right hip: Secondary | ICD-10-CM | POA: Insufficient documentation

## 2021-01-01 NOTE — Therapy (Signed)
[facility] [address], [place], [address] Phone: [phone]   Fax:  [phone]  Physical Therapy Evaluation  Patient Details  Name: Sheryl Morrow MRN: [id number] Date of Birth: 09/16/1975 Referring Provider (PT): Burnard Hawthorne, FNP  Encounter Date: 01/01/2021   PT End of Session - 01/01/21 1110     Visit Number 1    Number of Visits 17    Date for PT Re-Evaluation 03/01/21    Authorization Type 1    Authorization Time Period 10    PT Start Time 1110    PT Stop Time 0277    PT Time Calculation (min) 46 min    Activity Tolerance Patient tolerated treatment well    Behavior During Therapy [facility] for tasks assessed/performed             Past Medical History:  Diagnosis Date   Hemorrhoids     Past Surgical History:  Procedure Laterality Date   APPENDECTOMY     CESAREAN SECTION  09/2008   CYSTOSCOPY WITH URETHRAL DILATATION  2008   DILATION AND CURETTAGE OF UTERUS     twice in 2007   GALLBLADDER SURGERY     2007   [place]  12/2007    There were no vitals filed for this visit.    Subjective Assessment - 01/01/21 1117     Subjective R hip pain: 3/10 currently, and at worst for the past 3 months. Low back 2/10 currently (pt standing); 4/10 at worst for the past 3 months.    Pertinent History Low back pain. Back pain begain about a couple of years ago. Worst in the mornings but just delt with it. Gradual onset. No LE paresthesia. Denies loss of bowel or bladder control. Feels central low back pain and R lateral hip pain. R hip pain has been going on for greater than 2 years.    Patient Stated Goals Start jogging minimal like a mile or 2 again.    Currently in Pain? Yes    Pain Score 2     Pain Location Back    Pain Orientation Posterior;Lower    Pain Type Chronic pain    Pain Onset More than a month ago    Pain Frequency Occasional     Aggravating Factors  Back: the act of standing   R hip: R S/L   Pain Relieving Factors Tylenol, warm shower.                [facility] PT Assessment - 01/01/21 1113       Assessment   Medical Diagnosis M54.50 (ICD-10-CM) - Low back pain, unspecified back pain laterality, unspecified chronicity, unspecified whether sciatica present    Referring Provider (PT) Burnard Hawthorne, FNP    Onset Date/Surgical Date 12/15/20    Prior Therapy None      Precautions   Precaution Comments No known precautions      Restrictions   Other Position/Activity Restrictions No known restrictions      Balance Screen   Has the patient fallen in the past 6 months No    Has the patient had a decrease in activity level because of a fear of falling?  No    Is the patient reluctant to leave their home because of a fear of falling?  No      Posture/Postural Control   Posture Comments Forward neck, decreased  lumbar lordosis, slight L lateral shift,      AROM   Lumbar Flexion WFL, slight L trunk rotation    Lumbar Extension limited    Lumbar - Right Side Bend WFL    Lumbar - Left Side Bend WFL    Lumbar - Right Rotation [facility]    Lumbar - Left Rotation [facility]      Strength   Right Hip Flexion 4/5    Right Hip Extension 4/5    Right Hip ABduction 4/5    Left Hip Flexion 4+/5    Left Hip Extension 4/5    Left Hip ABduction 4-/5    Right Knee Flexion 4/5    Right Knee Extension 5/5    Left Knee Flexion 4/5    Left Knee Extension 5/5      Special Tests   Other special tests (-) repeated flexion test; (+) long sit test suggesting anterior nutaton of L innominate.      Ambulation/Gait   Gait Comments L trunk rotation and anterior pelvic rotation during L LE swing phase                        Objective measurements completed on examination: See above findings.   No latex allergies BP normal now per pt   TTP R greater trochanter. Increased R lumbar paraspinal muscle tension  (+)  step down test R > L   Therapeutic Exercise  Standing hip abduction isometrics 5x5 seconds  Reviewed and given as part of her HEP, PT demonstrated and verbalized understanding. Handout provided.   Seated transversus abdominis contraction 5 seconds x 2.   Reviewed and given as part of  her HEP to be performed throughout the day.    Improved exercise technique, movement at target joints, use of target muscles after mod verbal, visual, tactile cues.    Response to treatment Pt tolerated session well without aggravation of symptoms.   Clinical impresssion  Pt is a 45 year old female who came to physical therapy secondary to low back as well as R lateral hip pain. She also presents with altered gait pattern and posture, TTP, trunk and B hip weakness, decreased femoral control, and difficulty performing functional tasks such as standing up from sitting, getting out of bed in the morning, secondary to pain. Pt will benefit from skilled physical therapy services to address the aforementioned deficits.                    PT Education - 01/01/21 1249     Education Details ther-ex. HEP, plan of care    Person(s) Educated Patient    Methods Explanation;Demonstration;Tactile cues;Verbal cues;Handout    Comprehension Returned demonstration;Verbalized understanding              PT Short Term Goals - 01/01/21 1258       PT SHORT TERM GOAL #1   Title Pt will be independent with her initial HEP to decrease pain, improve strength, function, and ability to get out of bed as well as stand up from sittign more comfortably.    Baseline Pt has started her HEP (01/01/2021)    Time 3    Period Weeks    Status New    Target Date 01/25/21               PT Long Term Goals - 01/01/21 1300       PT LONG TERM GOAL #1   Title Pt  will have a decrease in low back pain to 1/10 or less at worst to promote ability to get out of bed in the morning as well as stand up from sitting  more comfortably.    Baseline 4/10 low back pain at worst for the past 3 months (01/01/2021)    Time 8    Period Weeks    Status New    Target Date 03/01/21      PT LONG TERM GOAL #2   Title Pt will have a decrease in R hip pain to 1/10 or less at worst to promote ability to lie down on her R side more comfortably.    Baseline 3/10 R hip pain at worst for the past 3 months. (01/01/2021)    Time 8    Period Weeks    Status New    Target Date 03/01/21      PT LONG TERM GOAL #3   Title Pt will improve bilateral hip extension and abduction strength by at least 1/2 MMT grade to promote ability to perform standing tasks more comfortably for her hip and back.    Baseline Hip extension 4/5 R and L, hip abduction 4/5 R, 4-/5 L (01/01/2021)    Time 8    Period Weeks    Status New    Target Date 03/01/21      PT LONG TERM GOAL #4   Title Pt will improve her FOTO score by at least 10 points as a demonstration of improved function.    Baseline Pt did not fill out FOTO (01/01/2021)    Time 3    Period Weeks    Status New    Target Date 03/01/21                    Plan - 01/01/21 1254     Clinical Impression Statement Pt is a 45 year old female who came to physical therapy secondary to low back as well as R lateral hip pain. She also presents with altered gait pattern and posture, TTP, trunk and B hip weakness, decreased femoral control, and difficulty performing functional tasks such as standing up from sitting, getting out of bed in the morning, secondary to pain. Pt will benefit from skilled physical therapy services to address the aforementioned deficits.    Personal Factors and Comorbidities Fitness;Past/Current Experience;Time since onset of injury/illness/exacerbation    Examination-Activity Limitations Stand;Transfers    Stability/Clinical Decision Making Stable/Uncomplicated    Clinical Decision Making Low    Rehab Potential Fair    PT Frequency 2x / week    PT Duration  8 weeks    PT Treatment/Interventions Therapeutic activities;Therapeutic exercise;Neuromuscular re-education;Patient/family education;Manual techniques;Dry needling;Spinal Manipulations;Joint Manipulations;Aquatic Therapy;Electrical Stimulation;Iontophoresis 88m/ml Dexamethasone;Traction    PT Next Visit Plan trunk, scapular, hip strengthening, isometric, concentric, eccentric glute med strengthening, lumbopelvic and femoral control, manual techniques, modalities prn.    Consulted and Agree with Plan of Care Patient             Patient will benefit from skilled therapeutic intervention in order to improve the following deficits and impairments:  Pain, Improper body mechanics, Postural dysfunction, Decreased strength  Visit Diagnosis: Chronic bilateral low back pain, unspecified whether sciatica present - Plan: PT plan of care cert/re-cert  Pain in right hip - Plan: PT plan of care cert/re-cert  Muscle weakness (generalized) - Plan: PT plan of care cert/re-cert     Problem List Patient Active Problem List   Diagnosis Date Noted  Low back pain 12/15/2020   HTN (hypertension) 04/12/2020   Screen for colon cancer 04/12/2020   Elevated blood pressure reading 03/09/2020   Rectal bleeding 08/06/2017   Weight gain 08/06/2017   Elevated cholesterol 08/06/2017   Exposure to hepatitis C 08/06/2017   Abnormal CBC 08/06/2017   Vitamin D deficiency 08/06/2017   Anxiety 07/25/2016   Hemorrhoids 07/25/2016   GERD (gastroesophageal reflux disease) 07/25/2016   Routine physical examination 02/08/2016   Viral pharyngitis 02/08/2016   Preventative health care 07/24/2015     Joneen Boers PT, DPT    01/01/2021, 1:14 PM  [facility] [address], [place], [address] Phone: [phone]   Fax:  [phone]  Name: Sheryl Morrow MRN: [id number] Date of Birth: 1975/07/07

## 2021-01-01 NOTE — Patient Instructions (Signed)
   Stand with your feet shoulder width apart, the stretch the ground comfortably  10 seconds, 10 times, for 3 sets  3 times a day.

## 2021-01-03 ENCOUNTER — Ambulatory Visit: Payer: 59

## 2021-02-02 ENCOUNTER — Telehealth: Payer: Self-pay | Admitting: Family

## 2021-02-02 DIAGNOSIS — F419 Anxiety disorder, unspecified: Secondary | ICD-10-CM

## 2021-02-02 NOTE — Telephone Encounter (Signed)
Patient is requesting a refill on her clonazePAM (KLONOPIN) 0.5 MG tablet

## 2021-02-02 NOTE — Telephone Encounter (Signed)
RX Refill: klonopin Last Seen: 12-15-20 Last Ordered: 10-20-20 Next Appt: 03-27-20

## 2021-02-05 ENCOUNTER — Ambulatory Visit: Payer: 59 | Admitting: Dermatology

## 2021-02-05 MED ORDER — CLONAZEPAM 0.5 MG PO TABS: 0.5000 mg | ORAL_TABLET | Freq: Two times a day (BID) | ORAL | 1 refills | Status: DC | PRN

## 2021-02-05 NOTE — Addendum Note (Signed)
Addended by: Burnard Hawthorne on: 02/05/2021 12:33 PM   Modules accepted: Orders

## 2021-02-05 NOTE — Telephone Encounter (Signed)
Call pt  Refilled klonopin Ask pt to request ELECTRONICALLY and not call in please as slower  I looked up patient on [place] Controlled Substances Reporting System PMP AWARE and saw no activity that raised concern of inappropriate use.

## 2021-02-28 ENCOUNTER — Other Ambulatory Visit: Payer: Self-pay | Admitting: Family

## 2021-02-28 DIAGNOSIS — I10 Essential (primary) hypertension: Secondary | ICD-10-CM

## 2021-03-27 ENCOUNTER — Ambulatory Visit: Payer: 59 | Admitting: Family

## 2022-03-14 ENCOUNTER — Encounter: Payer: Self-pay | Admitting: Nurse Practitioner

## 2022-03-14 ENCOUNTER — Ambulatory Visit (INDEPENDENT_AMBULATORY_CARE_PROVIDER_SITE_OTHER): Payer: 59 | Admitting: Nurse Practitioner

## 2022-03-14 VITALS — BP 130/78 | HR 74 | Temp 98.6°F | Ht 67.0 in | Wt 207.6 lb

## 2022-03-14 DIAGNOSIS — H1031 Unspecified acute conjunctivitis, right eye: Secondary | ICD-10-CM | POA: Diagnosis not present

## 2022-03-14 MED ORDER — MOXIFLOXACIN HCL 0.5 % OP SOLN: 1.0000 [drp] | Freq: Three times a day (TID) | OPHTHALMIC | 0 refills | Status: DC

## 2022-03-14 NOTE — Progress Notes (Signed)
Established Patient Office Visit  Subjective:  Patient ID: Sheryl Morrow, female    DOB: Aug 12, 1975  Age: 47 y.o. MRN: [id number]  CC:  Chief Complaint  Patient presents with   Conjunctivitis     HPI  Sheryl Morrow presents for red eye  and discharge from the eyes started yesterday.   Conjunctivitis  Associated symptoms include eye discharge and eye redness. Pertinent negatives include no eye itching and no headaches.     Past Medical History:  Diagnosis Date   Hemorrhoids     Past Surgical History:  Procedure Laterality Date   APPENDECTOMY     CESAREAN SECTION  09/2008   CYSTOSCOPY WITH URETHRAL DILATATION  2008   DILATION AND CURETTAGE OF UTERUS     twice in 2007   GALLBLADDER SURGERY     2007   HERNIA REPAIR     embilical   UMBILICAL HERNIA REPAIR  12/2007    Family History  Problem Relation Age of Onset   Heart disease Father        Bypass   Hypertension Father    Diabetes Paternal Grandmother    High Cholesterol Mother    Breast cancer Neg Hx    Colon cancer Neg Hx     Social History   Socioeconomic History   Marital status: Married    Spouse name: Not on file   Number of children: Not on file   Years of education: Not on file   Highest education level: Not on file  Occupational History   Not on file  Tobacco Use   Smoking status: Former    Types: Cigarettes   Smokeless tobacco: Never  Substance and Sexual Activity   Alcohol use: Yes    Alcohol/week: 0.0 standard drinks of alcohol    Comment: Moderate use   Drug use: No   Sexual activity: Not on file  Other Topics Concern   Not on file  Social History Narrative   Daughter is 26.   Social Determinants of Health   Financial Resource Strain: Not on file  Food Insecurity: Not on file  Transportation Needs: Not on file  Physical Activity: Not on file  Stress: Not on file  Social Connections: Not on file  Intimate Partner Violence: Not on file     Outpatient Medications  Prior to Visit  Medication Sig Dispense Refill   amLODipine (NORVASC) 5 MG tablet TAKE 1 TABLET BY MOUTH AT  BEDTIME 90 tablet 3   Ascorbic Acid (VITAMIN C) 1000 MG tablet Take 1,000 mg by mouth daily.     CALCIUM-MAGNESIUM-ZINC PO Take 1 tablet by mouth daily.     Cholecalciferol (VITAMIN D3) 50 MCG (2000 UT) TABS Take 1 tablet by mouth 3 (three) times a week.     clonazePAM (KLONOPIN) 0.5 MG tablet Take 1 tablet (0.5 mg total) by mouth 2 (two) times daily as needed for anxiety. 14 tablet 1   omega-3 fish oil (MAXEPA) 1000 MG CAPS capsule Take 1 capsule by mouth 2 (two) times daily.     pimecrolimus (ELIDEL) 1 % cream Apply topically 2 (two) times daily. 30 g 02   tacrolimus (PROTOPIC) 0.1 % ointment Apply topically 2 (two) times daily. 30 g 1   No facility-administered medications prior to visit.    No Known Allergies  ROS Review of Systems  Constitutional: Negative.   Eyes:  Positive for discharge and redness. Negative for itching and visual disturbance.  Respiratory: Negative.    Cardiovascular:  Negative for leg swelling.  Gastrointestinal: Negative.   Neurological:  Negative for headaches.      Objective:    Physical Exam Constitutional:      Appearance: Normal appearance. She is obese.  HENT:     Right Ear: Tympanic membrane normal.     Left Ear: Tympanic membrane normal.     Mouth/Throat:     Mouth: Mucous membranes are moist.  Eyes:     Conjunctiva/sclera:     Right eye: Exudate present.  Cardiovascular:     Rate and Rhythm: Normal rate and regular rhythm.     Pulses: Normal pulses.     Heart sounds: Normal heart sounds.  Pulmonary:     Effort: Pulmonary effort is normal.     Breath sounds: Normal breath sounds.  Musculoskeletal:        General: No signs of injury. Normal range of motion.  Skin:    General: Skin is warm.     Coloration: Skin is not jaundiced or pale.  Neurological:     General: No focal deficit present.     Mental Status: She is alert  and oriented to person, place, and time. Mental status is at baseline.  Psychiatric:        Mood and Affect: Mood normal.        Behavior: Behavior normal.        Thought Content: Thought content normal.        Judgment: Judgment normal.     BP 130/78   Pulse 74   Temp 98.6 F (37 C) (Oral)   Ht '5\' 7"'$  (1.702 m)   Wt 207 lb 9.6 oz (94.2 kg)   SpO2 99%   BMI 32.51 kg/m  Wt Readings from Last 3 Encounters:  03/14/22 207 lb 9.6 oz (94.2 kg)  12/15/20 202 lb 9.6 oz (91.9 kg)  09/13/20 206 lb 3.2 oz (93.5 kg)     Health Maintenance  Topic Date Due   INFLUENZA VACCINE  06/02/2022 (Originally 10/02/2021)   PAP SMEAR-Modifier  03/10/2023   Fecal DNA (Cologuard)  06/21/2023   DTaP/Tdap/Td (2 - Td or Tdap) 08/07/2027   Hepatitis C Screening  Completed   HIV Screening  Completed   HPV VACCINES  Aged Out   COVID-19 Vaccine  Discontinued    There are no preventive care reminders to display for this patient.  Lab Results  Component Value Date   TSH 1.68 03/09/2020   Lab Results  Component Value Date   WBC 5.8 03/09/2020   HGB 14.1 03/09/2020   HCT 41.9 03/09/2020   MCV 96.7 03/09/2020   PLT 215.0 03/09/2020   Lab Results  Component Value Date   NA 136 03/17/2020   K 3.9 03/17/2020   CO2 31 03/17/2020   GLUCOSE 87 03/17/2020   BUN 16 03/17/2020   CREATININE 0.80 03/17/2020   BILITOT 0.8 03/09/2020   ALKPHOS 57 03/09/2020   AST 27 03/09/2020   ALT 24 03/09/2020   PROT 7.4 03/09/2020   ALBUMIN 4.8 03/09/2020   CALCIUM 9.8 03/17/2020   GFR 88.92 03/17/2020   Lab Results  Component Value Date   CHOL 290 (H) 03/09/2020   Lab Results  Component Value Date   HDL 116.40 03/09/2020   Lab Results  Component Value Date   LDLCALC 163 (H) 03/09/2020   Lab Results  Component Value Date   TRIG 51.0 03/09/2020   Lab Results  Component Value Date   CHOLHDL 2 03/09/2020   Lab  Results  Component Value Date   HGBA1C 5.2 03/09/2020      Assessment & Plan:    Problem List Items Addressed This Visit       Other   Acute conjunctivitis of right eye - Primary    Started her on Vigamox eyedrops. Advised patient to instill 1 drop twice a day for 5 days in the right eye.        Meds ordered this encounter  Medications   moxifloxacin (VIGAMOX) 0.5 % ophthalmic solution    Sig: Place 1 drop into the right eye 3 (three) times daily.    Dispense:  3 mL    Refill:  0     Follow-up: No follow-ups on file.    Theresia Lo, NP

## 2022-03-20 DIAGNOSIS — H1031 Unspecified acute conjunctivitis, right eye: Secondary | ICD-10-CM | POA: Insufficient documentation

## 2022-03-20 NOTE — Assessment & Plan Note (Signed)
Started her on Vigamox eyedrops. Advised patient to instill 1 drop twice a day for 5 days in the right eye.

## 2022-08-26 ENCOUNTER — Other Ambulatory Visit: Payer: Self-pay

## 2022-08-26 ENCOUNTER — Emergency Department: Payer: 59

## 2022-08-26 ENCOUNTER — Ambulatory Visit (INDEPENDENT_AMBULATORY_CARE_PROVIDER_SITE_OTHER)
Admission: EM | Admit: 2022-08-26 | Discharge: 2022-08-26 | Disposition: A | Discharge status: Home / Self Care | Payer: 59 | Source: Home / Self Care

## 2022-08-26 ENCOUNTER — Observation Stay
Admission: EM | Admit: 2022-08-26 | Discharge: 2022-08-28 | Disposition: A | Discharge status: Home / Self Care | Payer: 59 | Attending: Internal Medicine | Admitting: Internal Medicine

## 2022-08-26 DIAGNOSIS — I10 Essential (primary) hypertension: Secondary | ICD-10-CM

## 2022-08-26 DIAGNOSIS — R1312 Dysphagia, oropharyngeal phase: Secondary | ICD-10-CM | POA: Insufficient documentation

## 2022-08-26 DIAGNOSIS — J36 Peritonsillar abscess: Secondary | ICD-10-CM | POA: Insufficient documentation

## 2022-08-26 DIAGNOSIS — Z79899 Other long term (current) drug therapy: Secondary | ICD-10-CM | POA: Insufficient documentation

## 2022-08-26 DIAGNOSIS — Z87891 Personal history of nicotine dependence: Secondary | ICD-10-CM | POA: Diagnosis not present

## 2022-08-26 DIAGNOSIS — J029 Acute pharyngitis, unspecified: Secondary | ICD-10-CM

## 2022-08-26 LAB — BASIC METABOLIC PANEL
Anion gap: 10 (ref 5–15)
BUN: 13 mg/dL (ref 6–20)
CO2: 26 mmol/L (ref 22–32)
Calcium: 9.8 mg/dL (ref 8.9–10.3)
Chloride: 98 mmol/L (ref 98–111)
Creatinine, Ser: 0.82 mg/dL (ref 0.44–1.00)
GFR, Estimated: >60 mL/min (ref >60)
Glucose, Bld: 115 mg/dL — ABNORMAL HIGH (ref 70–99)
Potassium: 3.8 mmol/L (ref 3.5–5.1)
Sodium: 134 mmol/L — ABNORMAL LOW (ref 135–145)

## 2022-08-26 LAB — CBC WITH DIFFERENTIAL/PLATELET
Abs Immature Granulocytes: 0.05 10*3/uL (ref 0.00–0.07)
Basophils Absolute: 0.1 10*3/uL (ref 0.0–0.1)
Basophils Relative: 1 %
Eosinophils Absolute: 0 10*3/uL (ref 0.0–0.5)
Eosinophils Relative: 0 %
HCT: 42.8 % (ref 36.0–46.0)
Hemoglobin: 14 g/dL (ref 12.0–15.0)
Immature Granulocytes: 0 %
Lymphocytes Relative: 11 %
Lymphs Abs: 1.4 10*3/uL (ref 0.7–4.0)
MCH: 31.4 pg (ref 26.0–34.0)
MCHC: 32.7 g/dL (ref 30.0–36.0)
MCV: 96 fL (ref 80.0–100.0)
Monocytes Absolute: 0.9 10*3/uL (ref 0.1–1.0)
Monocytes Relative: 7 %
Neutro Abs: 10.2 10*3/uL — ABNORMAL HIGH (ref 1.7–7.7)
Neutrophils Relative %: 81 %
Platelets: 221 10*3/uL (ref 150–400)
RBC: 4.46 MIL/uL (ref 3.87–5.11)
RDW: 12.5 % (ref 11.5–15.5)
WBC: 12.7 10*3/uL — ABNORMAL HIGH (ref 4.0–10.5)
nRBC: 0 % (ref 0.0–0.2)

## 2022-08-26 LAB — HEPATIC FUNCTION PANEL
ALT: 17 U/L (ref 0–44)
AST: 21 U/L (ref 15–41)
Albumin: 4.4 g/dL (ref 3.5–5.0)
Alkaline Phosphatase: 60 U/L (ref 38–126)
Bilirubin, Direct: 0.2 mg/dL (ref 0.0–0.2)
Indirect Bilirubin: 1.6 mg/dL — ABNORMAL HIGH (ref 0.3–0.9)
Total Bilirubin: 1.8 mg/dL — ABNORMAL HIGH (ref 0.3–1.2)
Total Protein: 7.9 g/dL (ref 6.5–8.1)

## 2022-08-26 LAB — GROUP A STREP BY PCR
Group A Strep by PCR: NOT DETECTED
Group A Strep by PCR: NOT DETECTED

## 2022-08-26 LAB — LIPASE, BLOOD: Lipase: 32 U/L (ref 11–51)

## 2022-08-26 MED ORDER — SODIUM CHLORIDE 0.9 % IV SOLN
3.0000 g | Freq: Four times a day (QID) | INTRAVENOUS | Status: DC
  Administered 2022-08-26 – 2022-08-28 (×5): 3 g via INTRAVENOUS
  Filled 2022-08-26 (×9): qty 8

## 2022-08-26 MED ORDER — ONDANSETRON HCL 4 MG PO TABS: 4.0000 mg | ORAL_TABLET | Freq: Four times a day (QID) | ORAL | Status: DC | PRN

## 2022-08-26 MED ORDER — ONDANSETRON HCL 4 MG/2ML IJ SOLN: 4.0000 mg | Freq: Four times a day (QID) | INTRAMUSCULAR | Status: DC | PRN

## 2022-08-26 MED ORDER — AMLODIPINE BESYLATE 5 MG PO TABS
5.0000 mg | ORAL_TABLET | Freq: Every day | ORAL | Status: DC
  Administered 2022-08-26 – 2022-08-27 (×2): 5 mg via ORAL
  Filled 2022-08-26 (×2): qty 1

## 2022-08-26 MED ORDER — DEXAMETHASONE SODIUM PHOSPHATE 10 MG/ML IJ SOLN
10.0000 mg | Freq: Once | INTRAMUSCULAR | Status: AC
  Administered 2022-08-26: 10 mg via INTRAVENOUS
  Filled 2022-08-26: qty 1

## 2022-08-26 MED ORDER — IOHEXOL 300 MG/ML  SOLN
75.0000 mL | Freq: Once | INTRAMUSCULAR | Status: AC | PRN
  Administered 2022-08-26: 75 mL via INTRAVENOUS

## 2022-08-26 MED ORDER — ACETAMINOPHEN 325 MG PO TABS: 650.0000 mg | ORAL_TABLET | Freq: Four times a day (QID) | ORAL | Status: DC | PRN

## 2022-08-26 MED ORDER — SODIUM CHLORIDE 0.9 % IV SOLN
3.0000 g | Freq: Once | INTRAVENOUS | Status: AC
  Administered 2022-08-26: 3 g via INTRAVENOUS
  Filled 2022-08-26: qty 8

## 2022-08-26 MED ORDER — ACETAMINOPHEN 650 MG RE SUPP: 650.0000 mg | Freq: Four times a day (QID) | RECTAL | Status: DC | PRN

## 2022-08-26 MED ORDER — DEXAMETHASONE SODIUM PHOSPHATE 10 MG/ML IJ SOLN
10.0000 mg | Freq: Three times a day (TID) | INTRAMUSCULAR | Status: DC
  Administered 2022-08-26 – 2022-08-28 (×4): 10 mg via INTRAVENOUS
  Filled 2022-08-26 (×4): qty 1

## 2022-08-26 MED ORDER — KETOROLAC TROMETHAMINE 30 MG/ML IJ SOLN
30.0000 mg | Freq: Four times a day (QID) | INTRAMUSCULAR | Status: DC | PRN
  Administered 2022-08-26 – 2022-08-27 (×3): 30 mg via INTRAVENOUS
  Filled 2022-08-26 (×3): qty 1

## 2022-08-26 NOTE — Assessment & Plan Note (Signed)
Continue amlodipine.  If unable to swallow pills we will switch to as needed IV hydralazine

## 2022-08-26 NOTE — Discharge Instructions (Addendum)
Please go to the emergency department at [facility] for further evaluation for your possible peritonsillar abscess.

## 2022-08-26 NOTE — ED Triage Notes (Signed)
Patient was referred here by the UC in [place] for CT for possible peritonsillar abscess

## 2022-08-26 NOTE — ED Notes (Signed)
Report received, this RN now assuming care.  

## 2022-08-26 NOTE — Progress Notes (Signed)
Pharmacy Antibiotic Note  LANITA STAMMEN is a 47 y.o. female w/ PMH of HTN admitted on 08/26/2022 with a peritonsillar abscess .  Pharmacy has been consulted for Unasyn dosing.  Plan: start Unasyn 3 grams IV every 6 hours ---follow renal function for dose adjustments  Height: 5\' 7"  (170.2 cm) Weight: 88.5 kg (195 lb) IBW/kg (Calculated) : 61.6  Temp (24hrs), Avg:98.1 F (36.7 C), Min:97.8 F (36.6 C), Max:98.4 F (36.9 C)  Recent Labs  Lab 08/26/22 1517  WBC 12.7*  CREATININE 0.82    Estimated Creatinine Clearance: 96.9 mL/min (by C-G formula based on SCr of 0.82 mg/dL).    No Known Allergies  Antimicrobials this admission: 06/24 Unasyn >>   Thank you for allowing pharmacy to be a part of this patient's care.  Lowella Bandy 08/26/2022 7:34 PM

## 2022-08-26 NOTE — H&P (Signed)
History and Physical    Patient: Sheryl Morrow UJW:[id number] DOB: [birth year] DOA: 08/26/2022 DOS: the patient was seen and examined on 08/26/2022 PCP: [name], FNP  Patient coming from: Home  Chief Complaint:  Chief Complaint  Patient presents with   Sore Throat    Patient was referred here by the UC in [place] for CT for possible peritonsillar abscess    HPI: Sheryl Morrow is a 47 y.o. female with medical history significant for Hypertension who presents to the ED from urgent care with a 3-day history of left-sided throat pain, submandibular swelling, and progressive pain difficulty swallowing, even liquids.  She has had no fever or chills and denies shortness of breath. ED course and data review: BP 162/109 with otherwise normal vitals labs notable for WBC 12,700. CT soft tissue neck showed peritonsillar abscess as follows: IMPRESSION: Left tonsillitis with a peritonsillar abscess at the inferior aspect measuring 8 mm in size. Considerable inflammatory disease in the parapharyngeal space on the left, exerting mass effect and displacing the hypopharynx towards the right.  Patient started on Unasyn and Decadron but after posttreatment observation in the ED continued to fail a swallow eval The ED provider spoke with ENT provider, Dr. Genevive Bi who advised on admission for 48 hours IV antibiotics and steroids with plans to discharge on a 2-week prednisone taper and Augmentin Hospitalist consulted for admission.   Review of Systems: As mentioned in the history of present illness. All other systems reviewed and are negative.  Past Medical History:  Diagnosis Date   Hemorrhoids    Past Surgical History:  Procedure Laterality Date   APPENDECTOMY     CESAREAN SECTION  09/2008   CYSTOSCOPY WITH URETHRAL DILATATION  2008   DILATION AND CURETTAGE OF UTERUS     twice in 2007   GALLBLADDER SURGERY     2007   HERNIA REPAIR     embilical   UMBILICAL HERNIA REPAIR   12/2007   Social History:  reports that she has quit smoking. Her smoking use included cigarettes. She has never used smokeless tobacco. She reports current alcohol use. She reports that she does not use drugs.  No Known Allergies  Family History  Problem Relation Age of Onset   Heart disease Father        Bypass   Hypertension Father    Diabetes Paternal Grandmother    High Cholesterol Mother    Breast cancer Neg Hx    Colon cancer Neg Hx     Prior to Admission medications   Medication Sig Start Date End Date Taking? Authorizing Provider  amLODipine (NORVASC) 5 MG tablet TAKE 1 TABLET BY MOUTH AT  BEDTIME 02/28/21   [name], FNP  Ascorbic Acid (VITAMIN C) 1000 MG tablet Take 1,000 mg by mouth daily.    [provider]  CALCIUM-MAGNESIUM-ZINC PO Take 1 tablet by mouth daily.    [provider]  Cholecalciferol (VITAMIN D3) 50 MCG (2000 UT) TABS Take 1 tablet by mouth 3 (three) times a week.    [provider]  clonazePAM (KLONOPIN) 0.5 MG tablet Take 1 tablet (0.5 mg total) by mouth 2 (two) times daily as needed for anxiety. 02/05/21   [name], FNP  moxifloxacin (VIGAMOX) 0.5 % ophthalmic solution Place 1 drop into the right eye 3 (three) times daily. 03/14/22   Kara Dies, NP  omega-3 fish oil (MAXEPA) 1000 MG CAPS capsule Take 1 capsule by mouth 2 (two) times daily.  [provider]  pimecrolimus (ELIDEL) 1 % cream Apply topically 2 (two) times daily. 12/21/20   Deirdre Evener, MD  tacrolimus (PROTOPIC) 0.1 % ointment Apply topically 2 (two) times daily. 12/26/20   Deirdre Evener, MD    Physical Exam: Vitals:   08/26/22 1515 08/26/22 1517  BP:  (!) 162/109  Pulse:  87  Resp:  18  Temp:  98.4 F (36.9 C)  TempSrc:  Oral  SpO2:  100%  Weight: 88.5 kg   Height: 5\' 7"  (1.702 m)    Physical Exam Vitals and nursing note reviewed.  Constitutional:      General: She is not in acute distress. HENT:      Head: Normocephalic and atraumatic.     Mouth/Throat:     Comments: Trismus.  Fullness with tenderness left submandibular area Cardiovascular:     Rate and Rhythm: Normal rate and regular rhythm.     Heart sounds: Normal heart sounds.  Pulmonary:     Effort: Pulmonary effort is normal.     Breath sounds: Normal breath sounds.  Abdominal:     Palpations: Abdomen is soft.     Tenderness: There is no abdominal tenderness.  Neurological:     Mental Status: Mental status is at baseline.     Labs on Admission: I have personally reviewed following labs and imaging studies  CBC: Recent Labs  Lab 08/26/22 1517  WBC 12.7*  NEUTROABS 10.2*  HGB 14.0  HCT 42.8  MCV 96.0  PLT 221   Basic Metabolic Panel: Recent Labs  Lab 08/26/22 1517  NA 134*  K 3.8  CL 98  CO2 26  GLUCOSE 115*  BUN 13  CREATININE 0.82  CALCIUM 9.8   GFR: Estimated Creatinine Clearance: 96.9 mL/min (by C-G formula based on SCr of 0.82 mg/dL). Liver Function Tests: No results for input(s): "AST", "ALT", "ALKPHOS", "BILITOT", "PROT", "ALBUMIN" in the last 168 hours. No results for input(s): "LIPASE", "AMYLASE" in the last 168 hours. No results for input(s): "AMMONIA" in the last 168 hours. Coagulation Profile: No results for input(s): "INR", "PROTIME" in the last 168 hours. Cardiac Enzymes: No results for input(s): "CKTOTAL", "CKMB", "CKMBINDEX", "TROPONINI" in the last 168 hours. BNP (last 3 results) No results for input(s): "PROBNP" in the last 8760 hours. HbA1C: No results for input(s): "HGBA1C" in the last 72 hours. CBG: No results for input(s): "GLUCAP" in the last 168 hours. Lipid Profile: No results for input(s): "CHOL", "HDL", "LDLCALC", "TRIG", "CHOLHDL", "LDLDIRECT" in the last 72 hours. Thyroid Function Tests: No results for input(s): "TSH", "T4TOTAL", "FREET4", "T3FREE", "THYROIDAB" in the last 72 hours. Anemia Panel: No results for input(s): "VITAMINB12", "FOLATE", "FERRITIN", "TIBC",  "IRON", "RETICCTPCT" in the last 72 hours. Urine analysis: No results found for: "COLORURINE", "APPEARANCEUR", "LABSPEC", "PHURINE", "GLUCOSEU", "HGBUR", "BILIRUBINUR", "KETONESUR", "PROTEINUR", "UROBILINOGEN", "NITRITE", "LEUKOCYTESUR"  Radiological Exams on Admission: CT Soft Tissue Neck W Contrast  Result Date: 08/26/2022 CLINICAL DATA:  Epiglottitis or tonsillitis suspected.  Sore throat. EXAM: CT NECK WITH CONTRAST TECHNIQUE: Multidetector CT imaging of the neck was performed using the standard protocol following the bolus administration of intravenous contrast. RADIATION DOSE REDUCTION: This exam was performed according to the departmental dose-optimization program which includes automated exposure control, adjustment of the mA and/or kV according to patient size and/or use of iterative reconstruction technique. CONTRAST:  75mL OMNIPAQUE IOHEXOL 300 MG/ML  SOLN COMPARISON:  None Available. FINDINGS: Pharynx and larynx: Right tonsillar region appears unremarkable except for some small tonsillar stones. On the  left, there is inflammation throughout the region of the tonsil with a peritonsillar abscess at the inferior aspect measuring 8 mm in size. There is considerable inflammatory disease in the parapharyngeal space on the left, exerting mass effect and displacing the hypopharynx towards the right. Salivary glands: Parotid and submandibular glands are normal. Left submandibular gland is somewhat displaced by the adjacent inflammatory change. Thyroid: Normal Lymph nodes: No suppurative nodal disease. Vascular: Normal Limited intracranial: Normal Visualized orbits: Normal Mastoids and visualized paranasal sinuses: Clear Skeleton: Ordinary cervical spondylosis. Upper chest: Normal Other: None IMPRESSION: Left tonsillitis with a peritonsillar abscess at the inferior aspect measuring 8 mm in size. Considerable inflammatory disease in the parapharyngeal space on the left, exerting mass effect and displacing  the hypopharynx towards the right. Electronically Signed   By: Paulina Fusi M.D.   On: 08/26/2022 17:53     Data Reviewed: Relevant notes from primary care and specialist visits, past discharge summaries as available in EHR, including Care Everywhere. Prior diagnostic testing as pertinent to current admission diagnoses Updated medications and problem lists for reconciliation ED course, including vitals, labs, imaging, treatment and response to treatment Triage notes, nursing and pharmacy notes and ED provider's notes Notable results as noted in HPI   Assessment and Plan: * Peritonsillar abscess, with dysphagia and odynophagia Continue Unasyn and Decadron Toradol for pain if unable to swallow Cautious clear liquids to advance to soft diet as tolerated ENT consult  HTN (hypertension) Continue amlodipine.  If unable to swallow pills we will switch to as needed IV hydralazine    DVT prophylaxis: None as patient is ambulatory  Consults: ENT Dr. Genevive Bi  Advance Care Planning: ful code  Family Communication: none  Disposition Plan: Back to previous home environment  Severity of Illness: The appropriate patient status for this patient is OBSERVATION. Observation status is judged to be reasonable and necessary in order to provide the required intensity of service to ensure the patient's safety. The patient's presenting symptoms, physical exam findings, and initial radiographic and laboratory data in the context of their medical condition is felt to place them at decreased risk for further clinical deterioration. Furthermore, it is anticipated that the patient will be medically stable for discharge from the hospital within 2 midnights of admission.   Author: Andris Baumann, MD 08/26/2022 7:21 PM  For on call review [email or web address].

## 2022-08-26 NOTE — Assessment & Plan Note (Signed)
Continue Unasyn and Decadron Toradol for pain if unable to swallow Cautious clear liquids to advance to soft diet as tolerated ENT consult

## 2022-08-26 NOTE — ED Notes (Signed)
Dr. Duncan at bedside 

## 2022-08-26 NOTE — ED Notes (Signed)
ED Provider at bedside. 

## 2022-08-26 NOTE — ED Triage Notes (Signed)
Patient presents to [facility] for left ear pain and throat pain x 2 days. Treating with tylenol. States she just returned from traveling.

## 2022-08-26 NOTE — ED Notes (Signed)
Patient is being discharged from the Urgent Care and sent to the Emergency Department via EMS . Per Alycia Rossetti, NP, patient is in need of higher level of care due to peritonsillar abscess. Patient is aware and verbalizes understanding of plan of care.  Vitals:   08/26/22 1314  BP: (!) 152/104  Pulse: 77  Resp: 16  Temp: 97.8 F (36.6 C)  SpO2: 98%

## 2022-08-26 NOTE — ED Provider Notes (Signed)
[facility] Provider Note  Patient Contact: 4:20 PM (approximate)   History   Sore Throat (Patient was referred here by the UC in [place] for CT for possible peritonsillar abscess)   HPI  Sheryl Morrow is a 47 y.o. female with an unremarkable past medical history, presents to the emergency department with left-sided submandibular swelling and pharyngitis.  Patient states that she developed a sore throat on Friday and on Sunday it became difficult to eat.  She states that she can manage her own secretions but it does hurt to swallow.  She has no pain underneath the tongue.  No chest pain, chest tightness or abdominal pain.      Physical Exam   Triage Vital Signs: ED Triage Vitals  Enc Vitals Group     BP 08/26/22 1517 (!) 162/109     Pulse Rate 08/26/22 1517 87     Resp 08/26/22 1517 18     Temp 08/26/22 1517 98.4 F (36.9 C)     Temp Source 08/26/22 1517 Oral     SpO2 08/26/22 1517 100 %     Weight 08/26/22 1515 195 lb (88.5 kg)     Height 08/26/22 1515 5\' 7"  (1.702 m)     Head Circumference --      Peak Flow --      Pain Score 08/26/22 1514 6     Pain Loc --      Pain Edu? --      Excl. in GC? --     Most recent vital signs: Vitals:   08/26/22 1517  BP: (!) 162/109  Pulse: 87  Resp: 18  Temp: 98.4 F (36.9 C)  SpO2: 100%     General: Alert and in no acute distress. Eyes:  PERRL. EOMI. Head: No acute traumatic findings ENT:      Nose: No congestion/rhinnorhea.      Mouth/Throat: Mucous membranes are moist.  Mallampati score of 4.  Posterior pharynx is difficult to visualize but soft palate on the left appears edematous. Neck: No stridor. No cervical spine tenderness to palpation. Hematological/Lymphatic/Immunilogical: Palpable cervical lymphadenopathy. Cardiovascular:  Good peripheral perfusion Respiratory: Normal respiratory effort without tachypnea or retractions. Lungs CTAB. Good air entry to the bases with no decreased or  absent breath sounds. Gastrointestinal: Bowel sounds 4 quadrants. Soft and nontender to palpation. No guarding or rigidity. No palpable masses. No distention. No CVA tenderness. Musculoskeletal: Full range of motion to all extremities.  Neurologic:  No gross focal neurologic deficits are appreciated.  Skin:   No rash noted    ED Results / Procedures / Treatments   Labs (all labs ordered are listed, but only abnormal results are displayed) Labs Reviewed  CBC WITH DIFFERENTIAL/PLATELET - Abnormal; Notable for the following components:      Result Value   WBC 12.7 (*)    Neutro Abs 10.2 (*)    All other components within normal limits  BASIC METABOLIC PANEL - Abnormal; Notable for the following components:   Sodium 134 (*)    Glucose, Bld 115 (*)    All other components within normal limits  HEPATIC FUNCTION PANEL - Abnormal; Notable for the following components:   Total Bilirubin 1.8 (*)    Indirect Bilirubin 1.6 (*)    All other components within normal limits  GROUP A STREP BY PCR  LIPASE, BLOOD  HIV ANTIBODY (ROUTINE TESTING W REFLEX)      RADIOLOGY  I personally viewed and evaluated these images  as part of my medical decision making, as well as reviewing the written report by the radiologist.  ED Provider Interpretation: No.  8 mm PTA on the left.   PROCEDURES:  Critical Care performed: No  Procedures   MEDICATIONS ORDERED IN ED: Medications  amLODipine (NORVASC) tablet 5 mg (has no administration in time range)  acetaminophen (TYLENOL) tablet 650 mg (has no administration in time range)    Or  acetaminophen (TYLENOL) suppository 650 mg (has no administration in time range)  ondansetron (ZOFRAN) tablet 4 mg (has no administration in time range)    Or  ondansetron (ZOFRAN) injection 4 mg (has no administration in time range)  ketorolac (TORADOL) 30 MG/ML injection 30 mg (30 mg Intravenous Given 08/26/22 1949)  dexamethasone (DECADRON) injection 10 mg (has no  administration in time range)  Ampicillin-Sulbactam (UNASYN) 3 g in sodium chloride 0.9 % 100 mL IVPB (has no administration in time range)  dexamethasone (DECADRON) injection 10 mg (10 mg Intravenous Given 08/26/22 1638)  Ampicillin-Sulbactam (UNASYN) 3 g in sodium chloride 0.9 % 100 mL IVPB (0 g Intravenous Stopped 08/26/22 1744)  iohexol (OMNIPAQUE) 300 MG/ML solution 75 mL (75 mLs Intravenous Contrast Given 08/26/22 1740)     IMPRESSION / MDM / ASSESSMENT AND PLAN / ED COURSE  I reviewed the triage vital signs and the nursing notes.                              Assessment and plan:  Pharyngitis 47 year old female presents to the emergency department with left-sided pharyngitis for the past 3 days and was referred by [facility] urgent care with concern for peritonsillar abscess.  Patient was hypertensive at triage but vital signs were otherwise reassuring.  On exam, patient alert and nontoxic-appearing.  Patient had Mallampati score of 4 and posterior pharynx was difficult to visualize  Will administer Unasyn and Decadron empirically and will obtain CT soft tissue neck and will reassess.  Differential diagnosis at this time includes peritonsillar abscess, tonsillitis, Ludwig's, sialadenitis...  CT soft tissue neck indicated an 8 mm peritonsillar abscess on the left.  Patient had difficulty with p.o. challenge and did not feel comfortable being discharged.  I did review patient case with otolaryngologist on-call, Dr. Elenore Rota who agreed that admission is appropriate at this time for IV antibiotics and further care and management.  All patient questions were answered prior to admission.      FINAL CLINICAL IMPRESSION(S) / ED DIAGNOSES   Final diagnoses:  Pharyngitis, unspecified etiology     Rx / DC Orders   ED Discharge Orders     None        Note:  This document was prepared using Dragon voice recognition software and may include unintentional dictation errors.   Pia Mau Blennerhassett, Cordelia Poche 08/26/22 1954    Minna Antis, MD 08/26/22 2251

## 2022-08-26 NOTE — ED Provider Notes (Signed)
[facility]    CSN: [id number] Arrival date & time: 08/26/22  1300      History   Chief Complaint Chief Complaint  Patient presents with   Facial Pain    HPI Sheryl Morrow is a 47 y.o. female.   HPI  47 year old female with a past medical history significant for hypertension, anxiety, and high cholesterol presents for evaluation of left ear pain and left-sided sore throat that started 2 days ago.  She denies any fever, runny nose, nasal congestion, or cough.  She states that the pain has markedly increased in the last 24 hours to the point where it is difficult to swallow water secondary to pain.  Past Medical History:  Diagnosis Date   Hemorrhoids     Patient Active Problem List   Diagnosis Date Noted   Acute conjunctivitis of right eye 03/20/2022   Low back pain 12/15/2020   HTN (hypertension) 04/12/2020   Screen for colon cancer 04/12/2020   Elevated blood pressure reading 03/09/2020   Rectal bleeding 08/06/2017   Weight gain 08/06/2017   Elevated cholesterol 08/06/2017   Exposure to hepatitis C 08/06/2017   Abnormal CBC 08/06/2017   Vitamin D deficiency 08/06/2017   Anxiety 07/25/2016   Hemorrhoids 07/25/2016   GERD (gastroesophageal reflux disease) 07/25/2016   Routine physical examination 02/08/2016   Viral pharyngitis 02/08/2016   Preventative health care 07/24/2015    Past Surgical History:  Procedure Laterality Date   APPENDECTOMY     CESAREAN SECTION  09/2008   CYSTOSCOPY WITH URETHRAL DILATATION  2008   DILATION AND CURETTAGE OF UTERUS     twice in 2007   GALLBLADDER SURGERY     2007   HERNIA REPAIR     embilical   UMBILICAL HERNIA REPAIR  12/2007    OB History   No obstetric history on file.      Home Medications    Prior to Admission medications   Medication Sig Start Date End Date Taking? Authorizing Provider  amLODipine (NORVASC) 5 MG tablet TAKE 1 TABLET BY MOUTH AT  BEDTIME 02/28/21   [name], FNP   Ascorbic Acid (VITAMIN C) 1000 MG tablet Take 1,000 mg by mouth daily.    [provider]  CALCIUM-MAGNESIUM-ZINC PO Take 1 tablet by mouth daily.    [provider]  Cholecalciferol (VITAMIN D3) 50 MCG (2000 UT) TABS Take 1 tablet by mouth 3 (three) times a week.    [provider]  clonazePAM (KLONOPIN) 0.5 MG tablet Take 1 tablet (0.5 mg total) by mouth 2 (two) times daily as needed for anxiety. 02/05/21   [name], FNP  moxifloxacin (VIGAMOX) 0.5 % ophthalmic solution Place 1 drop into the right eye 3 (three) times daily. 03/14/22   Kara Dies, NP  omega-3 fish oil (MAXEPA) 1000 MG CAPS capsule Take 1 capsule by mouth 2 (two) times daily.    [provider]  pimecrolimus (ELIDEL) 1 % cream Apply topically 2 (two) times daily. 12/21/20   Deirdre Evener, MD  tacrolimus (PROTOPIC) 0.1 % ointment Apply topically 2 (two) times daily. 12/26/20   Deirdre Evener, MD    Family History Family History  Problem Relation Age of Onset   Heart disease Father        Bypass   Hypertension Father    Diabetes Paternal Grandmother    High Cholesterol Mother    Breast cancer Neg Hx    Colon cancer Neg Hx  Social History Social History   Tobacco Use   Smoking status: Former    Types: Cigarettes   Smokeless tobacco: Never  Substance Use Topics   Alcohol use: Yes    Alcohol/week: 0.0 standard drinks of alcohol    Comment: Moderate use   Drug use: No     Allergies   Patient has no known allergies.   Review of Systems Review of Systems  Constitutional:  Negative for fever.  HENT:  Positive for ear pain and sore throat. Negative for congestion and rhinorrhea.   Respiratory:  Negative for cough.      Physical Exam Triage Vital Signs ED Triage Vitals  Enc Vitals Group     BP 08/26/22 1314 (!) 152/104     Pulse Rate 08/26/22 1314 77     Resp 08/26/22 1314 16     Temp 08/26/22 1314 97.8 F (36.6 C)     Temp Source 08/26/22  1314 Oral     SpO2 08/26/22 1314 98 %     Weight --      Height --      Head Circumference --      Peak Flow --      Pain Score 08/26/22 1312 4     Pain Loc --      Pain Edu? --      Excl. in GC? --    No data found.  Updated Vital Signs BP (!) 152/104 (BP Location: Left Arm)   Pulse 77   Temp 97.8 F (36.6 C) (Oral)   Resp 16   LMP 08/12/2022 (Approximate)   SpO2 98%   Visual Acuity Right Eye Distance:   Left Eye Distance:   Bilateral Distance:    Right Eye Near:   Left Eye Near:    Bilateral Near:     Physical Exam Vitals and nursing note reviewed.  Constitutional:      Appearance: Normal appearance. She is not ill-appearing.  HENT:     Head: Normocephalic and atraumatic.     Right Ear: Tympanic membrane, ear canal and external ear normal. There is no impacted cerumen.     Left Ear: Tympanic membrane, ear canal and external ear normal. There is no impacted cerumen.     Mouth/Throat:     Mouth: Mucous membranes are moist.     Pharynx: Oropharynx is clear. Posterior oropharyngeal erythema present. No oropharyngeal exudate.     Comments: Patient's left tonsillar pillar is markedly edematous and erythematous with intrusion to the midline of the oropharynx.  Right tonsillar pillar is unremarkable in appearance. Skin:    General: Skin is warm and dry.     Capillary Refill: Capillary refill takes less than 2 seconds.  Neurological:     General: No focal deficit present.     Mental Status: She is alert and oriented to person, place, and time.      UC Treatments / Results  Labs (all labs ordered are listed, but only abnormal results are displayed) Labs Reviewed  GROUP A STREP BY PCR    EKG   Radiology No results found.  Procedures Procedures (including critical care time)  Medications Ordered in UC Medications - No data to display  Initial Impression / Assessment and Plan / UC Course  I have reviewed the triage vital signs and the nursing  notes.  Pertinent labs & imaging results that were available during my care of the patient were reviewed by me and considered in my medical decision making (see  chart for details).   Patient is a nontoxic-appearing 47 year old female presenting for evaluation of left-sided sore throat and left ear pain as outlined HPI above.  Her physical exam reveals a unilateral erythematous and edematous left tonsillar pillar without appreciable exudate.  The tonsil does approach to the midline of the oropharynx.  Right tonsillar pillar is unremarkable.  She does have fullness and tenderness with palpation of the anterior neck at the angle of the jaw.  Otoscopic exam of both ears reveals patent external auditory canals and pearly gray tympanic membranes bilaterally.  Given the unilateral tonsillar swelling coupled with the fullness at the angle of the jaw and concern that the patient has a peritonsillar abscess.  A strep PCR was collected at triage and is pending.  I have advised the patient that she needs to go to the emergency department for CT imaging of her neck as well as ENT evaluation and drainage showed a peritonsillar abscess been present.  [facility]  She left ambulatory in stable condition with her husband driving.  Strep PCR is negative.   Final Clinical Impressions(s) / UC Diagnoses   Final diagnoses:  Peritonsillar abscess     Discharge Instructions      Please go to the emergency department at [facility] for further evaluation for your possible peritonsillar abscess.     ED Prescriptions   None    PDMP not reviewed this encounter.   Becky Augusta, NP 08/26/22 1345

## 2022-08-27 DIAGNOSIS — J36 Peritonsillar abscess: Secondary | ICD-10-CM | POA: Diagnosis not present

## 2022-08-27 LAB — HIV ANTIBODY (ROUTINE TESTING W REFLEX): HIV Screen 4th Generation wRfx: NONREACTIVE

## 2022-08-27 MED ORDER — IBUPROFEN 400 MG PO TABS
400.0000 mg | ORAL_TABLET | Freq: Three times a day (TID) | ORAL | Status: DC
  Administered 2022-08-27 – 2022-08-28 (×3): 400 mg via ORAL
  Filled 2022-08-27 (×3): qty 1

## 2022-08-27 NOTE — TOC CM/SW Note (Signed)
Transition of Care [facility]) - Inpatient Brief Assessment   Patient Details  Name: Sheryl Morrow MRN: [id number] Date of Birth: 1975-06-06  Transition of Care [facility]) CM/SW Contact:    Chapman Fitch, RN Phone Number: 08/27/2022, 1:54 PM   Clinical Narrative:    Transition of Care Asessment: Insurance and Status: Insurance coverage has been reviewed Patient has primary care physician: Yes       Social Determinants of Health Reivew: SDOH reviewed no interventions necessary Readmission risk has been reviewed: Yes Transition of care needs: no transition of care needs at this time

## 2022-08-27 NOTE — Progress Notes (Signed)
[facility]  - [place] [facility]   PATIENT NAME: Sheryl Morrow    MR#:  [id number]  DATE OF BIRTH:  11-05-1975  SUBJECTIVE:   Came in with pain on the left neck. Found to have [employer] abscess. Feels a lot better after IV steroids Toradol and antibiotic. Able to swallow well. Patient agreeable to try soft diet. No family at bedside. No fever.   VITALS:  Blood pressure (!) 142/91, pulse 73, temperature 97.8 F (36.6 C), temperature source Oral, resp. rate 18, height 5\' 7"  (1.702 m), weight 88.5 kg, last menstrual period 08/12/2022, SpO2 100 %.  PHYSICAL EXAMINATION:   GENERAL:  47 y.o.-year-old patient with no acute distress.  LUNGS: Normal breath sounds bilaterally, no wheezing CARDIOVASCULAR: S1, S2 normal. No murmur   ABDOMEN: Soft, nontender, nondistended. Bowel sounds present.  EXTREMITIES: No  edema b/l.    NEUROLOGIC: nonfocal  patient is alert and awake SKIN: No obvious rash, lesion, or ulcer.   LABORATORY PANEL:  CBC Recent Labs  Lab 08/26/22 1517  WBC 12.7*  HGB 14.0  HCT 42.8  PLT 221    Chemistries  Recent Labs  Lab 08/26/22 1517  NA 134*  K 3.8  CL 98  CO2 26  GLUCOSE 115*  BUN 13  CREATININE 0.82  CALCIUM 9.8  AST 21  ALT 17  ALKPHOS 60  BILITOT 1.8*   Cardiac Enzymes No results for input(s): "TROPONINI" in the last 168 hours. RADIOLOGY:  CT Soft Tissue Neck W Contrast  Result Date: 08/26/2022 CLINICAL DATA:  Epiglottitis or tonsillitis suspected.  Sore throat. EXAM: CT NECK WITH CONTRAST TECHNIQUE: Multidetector CT imaging of the neck was performed using the standard protocol following the bolus administration of intravenous contrast. RADIATION DOSE REDUCTION: This exam was performed according to the departmental dose-optimization program which includes automated exposure control, adjustment of the mA and/or kV according to patient size and/or use of iterative reconstruction technique. CONTRAST:  75mL OMNIPAQUE  IOHEXOL 300 MG/ML  SOLN COMPARISON:  None Available. FINDINGS: Pharynx and larynx: Right tonsillar region appears unremarkable except for some small tonsillar stones. On the left, there is inflammation throughout the region of the tonsil with a peritonsillar abscess at the inferior aspect measuring 8 mm in size. There is considerable inflammatory disease in the parapharyngeal space on the left, exerting mass effect and displacing the hypopharynx towards the right. Salivary glands: Parotid and submandibular glands are normal. Left submandibular gland is somewhat displaced by the adjacent inflammatory change. Thyroid: Normal Lymph nodes: No suppurative nodal disease. Vascular: Normal Limited intracranial: Normal Visualized orbits: Normal Mastoids and visualized paranasal sinuses: Clear Skeleton: Ordinary cervical spondylosis. Upper chest: Normal Other: None IMPRESSION: Left tonsillitis with a peritonsillar abscess at the inferior aspect measuring 8 mm in size. Considerable inflammatory disease in the parapharyngeal space on the left, exerting mass effect and displacing the hypopharynx towards the right. Electronically Signed   By: Paulina Fusi M.D.   On: 08/26/2022 17:53    Assessment and Plan  Sheryl Morrow is a 47 y.o. female with medical history significant for Hypertension who presents to the ED from urgent care with a 3-day history of left-sided throat pain, submandibular swelling, and progressive pain difficulty swallowing, even liquids.  She has had no fever or chills and denies shortness of breath. ED course and data review: BP 162/109 with otherwise normal vitals labs notable for WBC 12,700. CT soft tissue neck showed peritonsillar abscess as follows: IMPRESSION: Left tonsillitis with a peritonsillar  abscess at the inferior aspect measuring 8 mm in size. Considerable inflammatory disease in the parapharyngeal space on the left, exerting mass effect and displacing the hypopharynx towards the  right.   ED provider spoke with ENT provider, Dr. Genevive Bi who advised on admission for 48 hours IV antibiotics and steroids with plans to discharge on a 2-week prednisone taper and Augmentin.  Left Peritonsillar abscess, with dysphagia and odynophagia --Continue Unasyn and Decadron --Ibuprofen qid with meals and prn Toradol for pain if unable to swallow --- advance to soft diet as tolerated --ENT consult with Dr Elenore Rota -- patient tolerating clear liquid. Able to swallow well. Improved today. Will advance to soft diet.   HTN (hypertension) --Continue amlodipine.    Improving. If continues to show improvement will discharge tomorrow with oral antibiotics and steroids and follow-up ENT as outpatient. Patient is agreeable   DVT prophylaxis: None as patient is ambulatory   Consults: ENT Dr. Genevive Bi   Advance Care Planning: ful code   Family Communication: none   Disposition Plan: Back to previous home environment  Level of care: Med-Surg Status is: Observation The patient remains OBS appropriate and will d/c before 2 midnights.    TOTAL TIME TAKING CARE OF THIS PATIENT: 35 minutes.  >50% time spent on counselling and coordination of care  Note: This dictation was prepared with Dragon dictation along with smaller phrase technology. Any transcriptional errors that result from this process are unintentional.  Enedina Finner M.D    [facility]   CC: Primary care physician; [name], FNP

## 2022-08-28 DIAGNOSIS — J36 Peritonsillar abscess: Secondary | ICD-10-CM | POA: Diagnosis not present

## 2022-08-28 MED ORDER — PREDNISONE 10 MG PO TABS: 5.0000 mg | ORAL_TABLET | Freq: Every day | ORAL | 0 refills | Status: DC

## 2022-08-28 MED ORDER — AMLODIPINE BESYLATE 5 MG PO TABS
5.0000 mg | ORAL_TABLET | Freq: Every day | ORAL | 1 refills | Status: DC
Start: 2022-08-28 — End: 2022-09-11

## 2022-08-28 MED ORDER — AMOXICILLIN-POT CLAVULANATE 875-125 MG PO TABS: 1.0000 | ORAL_TABLET | Freq: Two times a day (BID) | ORAL | 0 refills | Status: AC

## 2022-08-28 MED ORDER — AMOXICILLIN-POT CLAVULANATE 875-125 MG PO TABS
1.0000 | ORAL_TABLET | Freq: Two times a day (BID) | ORAL | Status: DC
  Administered 2022-08-28: 1 via ORAL
  Filled 2022-08-28: qty 1

## 2022-08-28 NOTE — Progress Notes (Signed)
Patient's ride will be delayed to 1:00pm. Patient will keep RN updated. Desk NS updated discharge time.   Madie Reno, RN

## 2022-08-28 NOTE — Progress Notes (Signed)
Sheryl Morrow to be discharged Home per MD order. Discussed prescriptions and follow up appointments with the patient. Prescriptions given to patient, medication list explained in detail. Patient verbalized understanding.  Allergies as of 08/28/2022   No Known Allergies      Medication List     TAKE these medications    amLODipine 5 MG tablet Commonly known as: NORVASC Take 1 tablet (5 mg total) by mouth at bedtime.   amoxicillin-clavulanate 875-125 MG tablet Commonly known as: AUGMENTIN Take 1 tablet by mouth every 12 (twelve) hours for 16 doses.   clonazePAM 0.5 MG tablet Commonly known as: KLONOPIN Take 0.5 mg by mouth daily as needed for anxiety.   predniSONE 10 MG tablet Commonly known as: DELTASONE Take 0.5-4 tablets (5-40 mg total) by mouth daily with breakfast. Day 1: take 40 mg ( 4 tablets daily) Day 2-8: decrease by 5 mg ( 1/2 tab daily) until finished        Vitals:   08/28/22 0554 08/28/22 0802  BP: 122/81 (!) 129/95  Pulse: 75 61  Resp: 16 18  Temp: 97.8 F (36.6 C) 98.1 F (36.7 C)  SpO2: 98% 97%    Skin clean, dry and intact without evidence of skin break down and or skin tears. IV catheter discontinued intact. Site without signs and symptoms of complications. Dressing and pressure applied. Patient denies pain at this time. No complaints noted.  An After Visit Summary was printed and given to the patient. Patient escorted via wheelchair and discharged Home home via private auto.  Madie Reno, RN

## 2022-08-28 NOTE — Discharge Summary (Signed)
Physician Discharge Summary   Patient: Sheryl Morrow MRN: [id number] DOB: [birth year]  Admit date:     08/26/2022  Discharge date: 08/28/22  Discharge Physician: Enedina Finner   PCP: [name], FNP   Recommendations at discharge:    F/u ENT dr Genevive Bi in 1-2 weeks  Discharge Diagnoses: Principal Problem:   Peritonsillar abscess, with dysphagia and odynophagia Active Problems:   HTN (hypertension)   Dysphagia, oropharyngeal  Sheryl Morrow is a 47 y.o. female with medical history significant for Hypertension who presents to the ED from urgent care with a 3-day history of left-sided throat pain, submandibular swelling, and progressive pain difficulty swallowing, even liquids.  She has had no fever or chills and denies shortness of breath. ED course and data review: BP 162/109 with otherwise normal vitals labs notable for WBC 12,700. CT soft tissue neck showed peritonsillar abscess as follows: IMPRESSION: Left tonsillitis with a peritonsillar abscess at the inferior aspect measuring 8 mm in size. Considerable inflammatory disease in the parapharyngeal space on the left, exerting mass effect and displacing the hypopharynx towards the right.    ED provider spoke with ENT provider, Dr. Genevive Bi who advised on admission for 48 hours IV antibiotics and steroids with plans to discharge on a 2-week prednisone taper and Augmentin.   Left Peritonsillar abscess, with dysphagia and odynophagia --Continue Unasyn and Decadron --Ibuprofen qid with meals and prn Toradol for pain if unable to swallow --- advance to soft diet as tolerated --ENT consult with Dr Elenore Rota -- patient tolerating regular diet. Able to swallow well. Improved today.    HTN (hypertension) --Continue amlodipine.    Improving. will discharge today with oral antibiotics and steroids and follow-up ENT as outpatient. Patient is agreeable   DVT prophylaxis: None as patient is ambulatory   Consults: ENT Dr.  Genevive Bi   Advance Care Planning: ful code   Family Communication: none   Disposition Plan: Back to previous home environment        DISCHARGE MEDICATION: Allergies as of 08/28/2022   No Known Allergies      Medication List     TAKE these medications    amLODipine 5 MG tablet Commonly known as: NORVASC Take 1 tablet (5 mg total) by mouth at bedtime.   amoxicillin-clavulanate 875-125 MG tablet Commonly known as: AUGMENTIN Take 1 tablet by mouth every 12 (twelve) hours for 16 doses.   clonazePAM 0.5 MG tablet Commonly known as: KLONOPIN Take 0.5 mg by mouth daily as needed for anxiety.   predniSONE 10 MG tablet Commonly known as: DELTASONE Take 0.5-4 tablets (5-40 mg total) by mouth daily with breakfast. Day 1: take 40 mg ( 4 tablets daily) Day 2-8: decrease by 5 mg ( 1/2 tab daily) until finished        Follow-up Information     Sheryl Murders, MD. Schedule an appointment as soon as possible for a visit in 1 week(s).   Specialty: Otolaryngology Why: Peritonsillar abscess Contact information: [address] [place] [address] [phone]         [name], FNP. Schedule an appointment as soon as possible for a visit in 1 week(s).   Specialty: Family Medicine Contact information: [address] [place] [address] [phone]                Discharge Exam: Ceasar Mons Weights   08/26/22 1515  Weight: 88.5 kg   GENERAL:  47 y.o.-year-old patient with no acute distress.  LUNGS:  Normal breath sounds bilaterally, no wheezing CARDIOVASCULAR: S1, S2 normal. No murmur   ABDOMEN: Soft, nontender, nondistended. Bowel sounds present.  EXTREMITIES: No  edema b/l.    NEUROLOGIC: nonfocal  patient is alert and awake  Condition at discharge: fair  The results of significant diagnostics from this hospitalization (including imaging, microbiology, ancillary and laboratory) are listed below for reference.   Imaging  Studies: CT Soft Tissue Neck W Contrast  Result Date: 08/26/2022 CLINICAL DATA:  Epiglottitis or tonsillitis suspected.  Sore throat. EXAM: CT NECK WITH CONTRAST TECHNIQUE: Multidetector CT imaging of the neck was performed using the standard protocol following the bolus administration of intravenous contrast. RADIATION DOSE REDUCTION: This exam was performed according to the departmental dose-optimization program which includes automated exposure control, adjustment of the mA and/or kV according to patient size and/or use of iterative reconstruction technique. CONTRAST:  75mL OMNIPAQUE IOHEXOL 300 MG/ML  SOLN COMPARISON:  None Available. FINDINGS: Pharynx and larynx: Right tonsillar region appears unremarkable except for some small tonsillar stones. On the left, there is inflammation throughout the region of the tonsil with a peritonsillar abscess at the inferior aspect measuring 8 mm in size. There is considerable inflammatory disease in the parapharyngeal space on the left, exerting mass effect and displacing the hypopharynx towards the right. Salivary glands: Parotid and submandibular glands are normal. Left submandibular gland is somewhat displaced by the adjacent inflammatory change. Thyroid: Normal Lymph nodes: No suppurative nodal disease. Vascular: Normal Limited intracranial: Normal Visualized orbits: Normal Mastoids and visualized paranasal sinuses: Clear Skeleton: Ordinary cervical spondylosis. Upper chest: Normal Other: None IMPRESSION: Left tonsillitis with a peritonsillar abscess at the inferior aspect measuring 8 mm in size. Considerable inflammatory disease in the parapharyngeal space on the left, exerting mass effect and displacing the hypopharynx towards the right. Electronically Signed   By: Paulina Fusi M.D.   On: 08/26/2022 17:53    Microbiology: Results for orders placed or performed during the hospital encounter of 08/26/22  Group A Strep by PCR     Status: None   Collection Time:  08/26/22  7:03 PM   Specimen: Throat; Sterile Swab  Result Value Ref Range Status   Group A Strep by PCR NOT DETECTED NOT DETECTED Final    Comment: Performed at [facility], [address] [place], [place] [address]    Labs: CBC: Recent Labs  Lab 08/26/22 1517  WBC 12.7*  NEUTROABS 10.2*  HGB 14.0  HCT 42.8  MCV 96.0  PLT 221   Basic Metabolic Panel: Recent Labs  Lab 08/26/22 1517  NA 134*  K 3.8  CL 98  CO2 26  GLUCOSE 115*  BUN 13  CREATININE 0.82  CALCIUM 9.8   Liver Function Tests: Recent Labs  Lab 08/26/22 1517  AST 21  ALT 17  ALKPHOS 60  BILITOT 1.8*  PROT 7.9  ALBUMIN 4.4    Discharge time spent: greater than 30 minutes.  Signed: Enedina Finner, MD [facility] 08/28/2022

## 2022-08-29 ENCOUNTER — Telehealth: Payer: Self-pay

## 2022-08-29 NOTE — Transitions of Care (Post Inpatient/ED Visit) (Signed)
   08/29/2022  Name: Sheryl Morrow MRN: [id number] DOB: 12/02/1975  Today's TOC FU Call Status: Today's TOC FU Call Status:: Successful TOC FU Call Competed TOC FU Call Complete Date: 08/29/22  Transition Care Management Follow-up Telephone Call Date of Discharge: 08/28/22 Discharge Facility: [facility]) Type of Discharge: Inpatient Admission Primary Inpatient Discharge Diagnosis:: paryngitis How have you been since you were released from the hospital?: Better Any questions or concerns?: No  Items Reviewed: Did you receive and understand the discharge instructions provided?: Yes Medications obtained,verified, and reconciled?: Yes (Medications Reviewed) Any new allergies since your discharge?: No Dietary orders reviewed?: Yes Do you have support at home?: Yes People in Home: spouse  Medications Reviewed Today: Medications Reviewed Today     Reviewed by Karena Addison, LPN (Licensed Practical Nurse) on 08/29/22 at 1025  Med List Status: <None>   Medication Order Taking? Sig Documenting Provider Last Dose Status Informant  amLODipine (NORVASC) 5 MG tablet [id number]  Take 1 tablet (5 mg total) by mouth at bedtime. Enedina Finner, MD  Active   amoxicillin-clavulanate (AUGMENTIN) 875-125 MG tablet [id number]  Take 1 tablet by mouth every 12 (twelve) hours for 16 doses. Enedina Finner, MD  Active   clonazePAM Scarlette Calico) 0.5 MG tablet [id number] No Take 0.5 mg by mouth daily as needed for anxiety. [provider] Unknown PRN Active Self  predniSONE (DELTASONE) 10 MG tablet [id number]  Take 0.5-4 tablets (5-40 mg total) by mouth daily with breakfast. Day 1: take 40 mg ( 4 tablets daily) Day 2-8: decrease by 5 mg ( 1/2 tab daily) until finished Enedina Finner, MD  Active             Home Care and Equipment/Supplies: Were Home Health Services Ordered?: NA Any new equipment or medical supplies ordered?: NA  Functional Questionnaire: Do you need  assistance with bathing/showering or dressing?: No Do you need assistance with meal preparation?: No Do you need assistance with eating?: No Do you have difficulty maintaining continence: No Do you need assistance with getting out of bed/getting out of a chair/moving?: No Do you have difficulty managing or taking your medications?: No  Follow up appointments reviewed: PCP Follow-up appointment confirmed?: Yes Date of PCP follow-up appointment?: 09/04/22 Follow-up Provider: [facility] Follow-up appointment confirmed?: NA Do you need transportation to your follow-up appointment?: No Do you understand care options if your condition(s) worsen?: Yes-patient verbalized understanding    SIGNATURE Karena Addison, LPN [facility] Nurse Health Advisor Direct Dial [phone]

## 2022-08-30 ENCOUNTER — Ambulatory Visit (INDEPENDENT_AMBULATORY_CARE_PROVIDER_SITE_OTHER): Payer: 59 | Admitting: Family Medicine

## 2022-08-30 ENCOUNTER — Encounter: Payer: Self-pay | Admitting: Family Medicine

## 2022-08-30 VITALS — BP 118/78 | HR 79 | Temp 97.6°F | Ht 67.0 in | Wt 206.6 lb

## 2022-08-30 DIAGNOSIS — J36 Peritonsillar abscess: Secondary | ICD-10-CM | POA: Diagnosis not present

## 2022-08-30 MED ORDER — TRAMADOL HCL 50 MG PO TABS
50.0000 mg | ORAL_TABLET | Freq: Three times a day (TID) | ORAL | 0 refills | Status: AC | PRN
Start: 2022-08-30 — End: 2022-09-04

## 2022-08-30 NOTE — Patient Instructions (Signed)
Nice to see you. Please try the tramadol for pain control. If this makes you excessively drowsy please stop using it and let us know.  You can also alternate Tylenol over-the-counter every 8 hours with the tramadol. If you have to go back to using ibuprofen you need to start on Prilosec or Nexium to help reduce your risk of developing an ulcer. If you develop shortness of breath, worsening difficulty swallowing, fevers, or any new or worsening symptoms please go back to the emergency room.

## 2022-08-30 NOTE — Assessment & Plan Note (Signed)
Acute issue.  She reports some improvement since going into the hospital.  Today she presents for help with pain control.  Discussed I be hesitant to place her on any NSAIDs given that she is already on steroids.  Discussed that combination would place her at risk for issues with her stomach.  She will be treated with tramadol as she notes no history of seizures.  Discussed risk of drowsiness with this.  Discussed risk of respiratory depression with tramadol though advised this is minimal.  Advised to seek medical attention if she has any shortness of breath, increasing pain, trouble swallowing, or any new or worsening symptoms.  Discussed that breathing issues would be more likely to be related to worsening of her peritonsillar abscess.  Advised to finish Augmentin and prednisone.  She will see ENT as planned.  Discussed that if the tramadol was not beneficial and she had to go back to using ibuprofen over-the-counter over the weekend she would need to start on something like Prilosec or Nexium to help protect the lining of her stomach.

## 2022-08-30 NOTE — Progress Notes (Signed)
Marikay Alar, MD Phone: [phone]  Sheryl Morrow is a 47 y.o. female who presents today for same-day visit.  Peritonsillar abscess: Patient was evaluated in the emergency department and admitted to the hospital for this.  She was treated with IV antibiotics and IV steroids.  She was also given Toradol through the IV to help with her pain.  She progressed adequately to where she was able to take in some oral intake and was discharged home on Augmentin and prednisone.  She notes she has improved since going into the hospital.  She is able to drink liquids.  She notes in the hospital they were giving her a regular diet that was chopped up small even though she was supposed to be getting a liquid diet and thinks that may have irritated her throat some more.  She notes no fever or breathing issues.  She is requesting something to help with discomfort because over-the-counter ibuprofen is not beneficial for her.  She has an appointment with ENT on 09/11/2022.   Social History   Tobacco Use  Smoking Status Former   Types: Cigarettes  Smokeless Tobacco Never    Current Outpatient Medications on File Prior to Visit  Medication Sig Dispense Refill   amLODipine (NORVASC) 5 MG tablet Take 1 tablet (5 mg total) by mouth at bedtime. 90 tablet 1   amoxicillin-clavulanate (AUGMENTIN) 875-125 MG tablet Take 1 tablet by mouth every 12 (twelve) hours for 16 doses. 16 tablet 0   clonazePAM (KLONOPIN) 0.5 MG tablet Take 0.5 mg by mouth daily as needed for anxiety.     predniSONE (DELTASONE) 10 MG tablet Take 0.5-4 tablets (5-40 mg total) by mouth daily with breakfast. Day 1: take 40 mg ( 4 tablets daily) Day 2-8: decrease by 5 mg ( 1/2 tab daily) until finished 18 tablet 0   No current facility-administered medications on file prior to visit.     ROS see history of present illness  Objective  Physical Exam Vitals:   08/30/22 1217  BP: 118/78  Pulse: 79  Temp: 97.6 F (36.4 C)  SpO2: 99%     BP Readings from Last 3 Encounters:  08/30/22 118/78  08/28/22 (!) 129/95  08/26/22 (!) 152/104   Wt Readings from Last 3 Encounters:  08/30/22 206 lb 9.6 oz (93.7 kg)  08/26/22 195 lb (88.5 kg)  03/14/22 207 lb 9.6 oz (94.2 kg)    Physical Exam Constitutional:      General: She is not in acute distress.    Appearance: She is not diaphoretic.  HENT:     Right Ear: Tympanic membrane and ear canal normal.     Left Ear: Tympanic membrane and ear canal normal.     Mouth/Throat:   Cardiovascular:     Rate and Rhythm: Normal rate and regular rhythm.     Heart sounds: Normal heart sounds.  Pulmonary:     Effort: Pulmonary effort is normal.     Breath sounds: Normal breath sounds.  Skin:    General: Skin is warm and dry.  Neurological:     Mental Status: She is alert.      Assessment/Plan: Please see individual problem list.  Peritonsillar abscess, with dysphagia and odynophagia Assessment & Plan: Acute issue.  She reports some improvement since going into the hospital.  Today she presents for help with pain control.  Discussed I be hesitant to place her on any NSAIDs given that she is already on steroids.  Discussed that combination would place  her at risk for issues with her stomach.  She will be treated with tramadol as she notes no history of seizures.  Discussed risk of drowsiness with this.  Discussed risk of respiratory depression with tramadol though advised this is minimal.  Advised to seek medical attention if she has any shortness of breath, increasing pain, trouble swallowing, or any new or worsening symptoms.  Discussed that breathing issues would be more likely to be related to worsening of her peritonsillar abscess.  Advised to finish Augmentin and prednisone.  She will see ENT as planned.  Discussed that if the tramadol was not beneficial and she had to go back to using ibuprofen over-the-counter over the weekend she would need to start on something like Prilosec  or Nexium to help protect the lining of her stomach.  Orders: -     traMADol HCl; Take 1 tablet (50 mg total) by mouth every 8 (eight) hours as needed for up to 5 days.  Dispense: 15 tablet; Refill: 0   Return if symptoms worsen or fail to improve.   Marikay Alar, MD [facility]

## 2022-09-04 ENCOUNTER — Inpatient Hospital Stay: Payer: 59 | Admitting: Family

## 2022-09-11 ENCOUNTER — Telehealth: Payer: Self-pay | Admitting: Family

## 2022-09-11 ENCOUNTER — Encounter: Payer: Self-pay | Admitting: Family

## 2022-09-11 ENCOUNTER — Ambulatory Visit (INDEPENDENT_AMBULATORY_CARE_PROVIDER_SITE_OTHER): Payer: 59 | Admitting: Family

## 2022-09-11 VITALS — BP 122/76 | HR 82 | Temp 97.6°F | Ht 67.0 in | Wt 204.6 lb

## 2022-09-11 DIAGNOSIS — Z1231 Encounter for screening mammogram for malignant neoplasm of breast: Secondary | ICD-10-CM | POA: Diagnosis not present

## 2022-09-11 DIAGNOSIS — F419 Anxiety disorder, unspecified: Secondary | ICD-10-CM | POA: Diagnosis not present

## 2022-09-11 DIAGNOSIS — I10 Essential (primary) hypertension: Secondary | ICD-10-CM

## 2022-09-11 DIAGNOSIS — J36 Peritonsillar abscess: Secondary | ICD-10-CM

## 2022-09-11 DIAGNOSIS — R899 Unspecified abnormal finding in specimens from other organs, systems and tissues: Secondary | ICD-10-CM

## 2022-09-11 MED ORDER — CLONAZEPAM 0.5 MG PO TABS
0.5000 mg | ORAL_TABLET | Freq: Every day | ORAL | 0 refills | Status: DC | PRN
Start: 2022-09-11 — End: 2023-06-10

## 2022-09-11 NOTE — Telephone Encounter (Signed)
Noted  I have removed

## 2022-09-11 NOTE — Patient Instructions (Addendum)
I have given you clonazepam to use for flying.  This medication is a benzodiazepine and you must store in a safe place.  Do not take with alcohol.  Please use sparingly as to   Please call  and schedule your 3D mammogram and /or bone density scan as we discussed.   [facility]  ( new location in 2023)  [address], [place], [place] [address]  [place], [place]  [phone]    Nice to see you today

## 2022-09-11 NOTE — Assessment & Plan Note (Signed)
Pain has resolved.  I did reiterate the importance of completion of antibiotics.  Patient will follow-up with  ENT today.

## 2022-09-11 NOTE — Telephone Encounter (Signed)
Patient called stating she went to her referred ENT appointment. While the assistant was going over her medical Hx she stated other surgeries the patient stated she did not have. The patient is requesting that the Appendectomy, Cystoscopy with urethral dilation and curettage of uterus twice in 2007 , Gallbladder surgery 2007  Be removed from her chart. She stated she did not have those surgeries.

## 2022-09-11 NOTE — Assessment & Plan Note (Signed)
Patient is not on amlodipine and blood pressure is well-controlled.  Advised patient to continue to monitor without antihypertensive

## 2022-09-11 NOTE — Progress Notes (Signed)
Assessment & Plan:  Anxiety Assessment & Plan: Anxiety particularly around flying.  Provided her with clonazepam to use sparingly.  Counseled on risk of medication.  Orders: -     clonazePAM; Take 1 tablet (0.5 mg total) by mouth daily as needed for anxiety.  Dispense: 15 tablet; Refill: 0  Encounter for screening mammogram for malignant neoplasm of breast -     3D Screening Mammogram, Left and Right; Future  Abnormal laboratory test -     Comprehensive metabolic panel; Future -     CBC with Differential/Platelet; Future  Hypertension, unspecified type Assessment & Plan: Patient is not on amlodipine and blood pressure is well-controlled.  Advised patient to continue to monitor without antihypertensive   Peritonsillar abscess, with dysphagia and odynophagia Assessment & Plan: Pain has resolved.  I did reiterate the importance of completion of antibiotics.  Patient will follow-up with  ENT today.      Return precautions given.   Risks, benefits, and alternatives of the medications and treatment plan prescribed today were discussed, and patient expressed understanding.   Education regarding symptom management and diagnosis given to patient on AVS either electronically or printed.  Return in about 6 months (around 03/14/2023) for Complete Physical Exam.  Rennie Plowman, FNP  Subjective:    Patient ID: Sheryl Morrow, female    DOB: 1976-02-23, 47 y.o.   MRN: [id number]  CC: Sheryl Morrow is a 47 y.o. female who presents today for follow up.   HPI: Throat pain has resolved.  No trouble swallowing She didn't complete prednisone and augmentin as do not feel that she needed.  She also endorsed side effects of prednisone    She would like refill of klonopin as needed for flying.   Hospital admission 08/26/2022 for peritonsillar abscess with dysphagia and odynophagia.  Admitted for IV antibiotics and steroids. Strep not detected Discharged on 2-week prednisone  taper, Augmentin. Consult with Dr Elenore Rota.   She had follow-up with colleague 08/30/2022.  Provided tramadol for additional pain control  Appointment scheduled with ENT today  Allergies: Patient has no known allergies. Current Outpatient Medications on File Prior to Visit  Medication Sig Dispense Refill   predniSONE (DELTASONE) 10 MG tablet Take 0.5-4 tablets (5-40 mg total) by mouth daily with breakfast. Day 1: take 40 mg ( 4 tablets daily) Day 2-8: decrease by 5 mg ( 1/2 tab daily) until finished 18 tablet 0   No current facility-administered medications on file prior to visit.    Review of Systems  Constitutional:  Negative for chills and fever.  HENT:  Negative for sore throat and trouble swallowing.   Respiratory:  Negative for cough and shortness of breath.   Cardiovascular:  Negative for chest pain and palpitations.  Gastrointestinal:  Negative for nausea and vomiting.      Objective:    BP 122/76   Pulse 82   Temp 97.6 F (36.4 C) (Oral)   Ht 5\' 7"  (1.702 m)   Wt 204 lb 9.6 oz (92.8 kg)   LMP 08/12/2022 (Approximate)   SpO2 99%   BMI 32.04 kg/m  BP Readings from Last 3 Encounters:  09/11/22 122/76  08/30/22 118/78  08/28/22 (!) 129/95   Wt Readings from Last 3 Encounters:  09/11/22 204 lb 9.6 oz (92.8 kg)  08/30/22 206 lb 9.6 oz (93.7 kg)  08/26/22 195 lb (88.5 kg)    Physical Exam Vitals reviewed.  Constitutional:      Appearance: She is well-developed.  HENT:  Head:     Jaw: No trismus or swelling.     Mouth/Throat:     Pharynx: No pharyngeal swelling, oropharyngeal exudate, posterior oropharyngeal erythema or uvula swelling.     Tonsils: No tonsillar exudate or tonsillar abscesses.     Comments: Tonsils are well behind pilars Eyes:     Conjunctiva/sclera: Conjunctivae normal.  Cardiovascular:     Rate and Rhythm: Normal rate and regular rhythm.     Pulses: Normal pulses.     Heart sounds: Normal heart sounds.  Pulmonary:     Effort:  Pulmonary effort is normal.     Breath sounds: Normal breath sounds. No wheezing, rhonchi or rales.  Skin:    General: Skin is warm and dry.  Neurological:     Mental Status: She is alert.  Psychiatric:        Speech: Speech normal.        Behavior: Behavior normal.        Thought Content: Thought content normal.

## 2022-09-11 NOTE — Assessment & Plan Note (Addendum)
Anxiety particularly around flying.  Provided her with clonazepam to use sparingly.  Counseled on risk of medication.

## 2022-09-17 ENCOUNTER — Telehealth: Payer: Self-pay | Admitting: Family

## 2022-09-17 NOTE — Telephone Encounter (Signed)
LVM to call back.

## 2022-09-17 NOTE — Telephone Encounter (Signed)
LVM for pt to call back.

## 2022-09-17 NOTE — Telephone Encounter (Signed)
Spoke to pt and she is still in pain it has been 9 days she would like to know if she should go to UC or come here. Pt also inquired about Xrays  and  Antibiotic if possible. Will call back jf she decides to come here

## 2022-09-17 NOTE — Telephone Encounter (Signed)
Patient states she is returning call from Jenate [place], CMA.  I transferred call to [facility].

## 2022-09-17 NOTE — Telephone Encounter (Signed)
Pt would like to be called and she did not tell me what is was about

## 2022-09-19 NOTE — Telephone Encounter (Signed)
LMTCB
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
## 2022-10-23 ENCOUNTER — Other Ambulatory Visit: Payer: 59

## 2023-02-07 ENCOUNTER — Telehealth: Payer: Self-pay | Admitting: Family

## 2023-02-07 NOTE — Telephone Encounter (Signed)
Schedule pt for physical on 02/13/23 @ 10:30 am

## 2023-02-07 NOTE — Telephone Encounter (Signed)
Patient called and would like an appt for biometric screening and have paperwork filled out. We do not do that here. Please call with information how to proceed.

## 2023-02-12 ENCOUNTER — Encounter: Payer: Self-pay | Admitting: Family

## 2023-02-13 ENCOUNTER — Ambulatory Visit (INDEPENDENT_AMBULATORY_CARE_PROVIDER_SITE_OTHER): Payer: 59 | Admitting: Family

## 2023-02-13 ENCOUNTER — Encounter: Payer: Self-pay | Admitting: Family

## 2023-02-13 VITALS — BP 128/78 | HR 74 | Temp 97.8°F | Ht 67.0 in | Wt 213.2 lb

## 2023-02-13 DIAGNOSIS — Z13220 Encounter for screening for lipoid disorders: Secondary | ICD-10-CM

## 2023-02-13 DIAGNOSIS — Z0000 Encounter for general adult medical examination without abnormal findings: Secondary | ICD-10-CM

## 2023-02-13 DIAGNOSIS — K219 Gastro-esophageal reflux disease without esophagitis: Secondary | ICD-10-CM

## 2023-02-13 DIAGNOSIS — Z136 Encounter for screening for cardiovascular disorders: Secondary | ICD-10-CM

## 2023-02-13 DIAGNOSIS — Z0001 Encounter for general adult medical examination with abnormal findings: Secondary | ICD-10-CM | POA: Diagnosis not present

## 2023-02-13 DIAGNOSIS — R7309 Other abnormal glucose: Secondary | ICD-10-CM

## 2023-02-13 DIAGNOSIS — R899 Unspecified abnormal finding in specimens from other organs, systems and tissues: Secondary | ICD-10-CM | POA: Diagnosis not present

## 2023-02-13 NOTE — Telephone Encounter (Signed)
Forms have been printed off and filled out pt has to come back to get labs done before we can complete forms and fax back

## 2023-02-13 NOTE — Patient Instructions (Addendum)
Please call  and schedule your 3D mammogram and /or bone density scan as we discussed.   [facility]  ( new location in 2023)  [address], [place], [place] [address]  [place], [place]  [phone]     Long term use beyond 3 months of proton pump inhibitors ( PPI's) include Nexium, Prilosec, Protonix, Dexilant, and Prevacid.  Some medical studies have identified an association between the long-term use of PPIs and the development of numerous adverse conditions including intestinal infections, pneumonia, stomach cancer, osteoporosis-related bone fractures, chronic kidney disease, deficiencies of certain vitamins and minerals, heart attacks, strokes, dementia, and early death. Those studies have flaws, are not considered definitive, and do not establish a cause-and-effect relationship between PPIs and the adverse conditions. High-quality studies have found that PPIs do not significantly increase the risk of any of these conditions except intestinal infections. Nevertheless, we cannot exclude the possibility that PPIs might confer a small increase in the risk of developing these adverse conditions. For the treatment of GERD, gastroenterologists generally agree that the well-established benefits of PPIs far outweigh their theoretical risks.  Nonetheless I recommend trial wean off of PPI's  if uncomplicated acid reflux and use of histamine 2 blocker ( Pepcid AC).   Patients with history of esophagitis  or Barrett's esophagus should remain on PPI.   I generally recommend trying to control acid reflux with lifestyle modifications including avoiding trigger foods, not eating 2 hours prior to bedtime. You may use histamine 2 blockers daily to twice daily ( this is Pepcid) and then when symptoms flare, start back on PPI for short course.   Of note, we will need to do an endoscopy ( upper GI) to evaluate your esophagus, stomach in the future if acid reflux persists are you develop  red flag symptoms: trouble swallowing, hoarseness, chronic cough, unexplained weight loss.  Food Choices for Gastroesophageal Reflux Disease, Adult When you have gastroesophageal reflux disease (GERD), the foods you eat and your eating habits are very important. Choosing the right foods can help ease the discomfort of GERD. Consider working with a dietitian to help you make healthy food choices. What are tips for following this plan? Reading food labels Look for foods that are low in saturated fat. Foods that have less than 5% of daily value (DV) of fat and 0 g of trans fats may help with your symptoms. Cooking Cook foods using methods other than frying. This may include baking, steaming, grilling, or broiling. These are all methods that do not need a lot of fat for cooking. To add flavor, try to use herbs that are low in spice and acidity. Meal planning  Choose healthy foods that are low in fat, such as fruits, vegetables, whole grains, low-fat dairy products, lean meats, fish, and poultry. Eat frequent, small meals instead of three large meals each day. Eat your meals slowly, in a relaxed setting. Avoid bending over or lying down until 2-3 hours after eating. Limit high-fat foods such as fatty meats or fried foods. Limit your intake of fatty foods, such as oils, butter, and shortening. Avoid the following as told by your health care provider: Foods that cause symptoms. These may be different for different people. Keep a food diary to keep track of foods that cause symptoms. Alcohol. Drinking large amounts of liquid with meals. Eating meals during the 2-3 hours before bed. Lifestyle Maintain a healthy weight. Ask your health care provider what weight is healthy for you. If you  need to lose weight, work with your health care provider to do so safely. Exercise for at least 30 minutes on 5 or more days each week, or as told by your health care provider. Avoid wearing clothes that fit tightly  around your waist and chest. Do not use any products that contain nicotine or tobacco. These products include cigarettes, chewing tobacco, and vaping devices, such as e-cigarettes. If you need help quitting, ask your health care provider. Sleep with the head of your bed raised. Use a wedge under the mattress or blocks under the bed frame to raise the head of the bed. Chew sugar-free gum after mealtimes. What foods should I eat?  Eat a healthy, well-balanced diet of fruits, vegetables, whole grains, low-fat dairy products, lean meats, fish, and poultry. Each person is different. Foods that may trigger symptoms in one person may not trigger any symptoms in another person. Work with your health care provider to identify foods that are safe for you. The items listed above may not be a complete list of recommended foods and beverages. Contact a dietitian for more information. What foods should I avoid? Limiting some of these foods may help manage the symptoms of GERD. Everyone is different. Consult a dietitian or your health care provider to help you identify the exact foods to avoid, if any. Fruits Any fruits prepared with added fat. Any fruits that cause symptoms. For some people this may include citrus fruits, such as oranges, grapefruit, pineapple, and lemons. Vegetables Deep-fried vegetables. [place] fries. Any vegetables prepared with added fat. Any vegetables that cause symptoms. For some people, this may include tomatoes and tomato products, chili peppers, onions and garlic, and horseradish. Grains Pastries or quick breads with added fat. Meats and other proteins High-fat meats, such as fatty beef or pork, hot dogs, ribs, ham, sausage, salami, and bacon. Fried meat or protein, including fried fish and fried chicken. Nuts and nut butters, in large amounts. Dairy Whole milk and chocolate milk. Sour cream. Cream. Ice cream. Cream cheese. Milkshakes. Fats and oils Butter. Margarine. Shortening.  Ghee. Beverages Coffee and tea, with or without caffeine. Carbonated beverages. Sodas. Energy drinks. Fruit juice made with acidic fruits, such as orange or grapefruit. Tomato juice. Alcoholic drinks. Sweets and desserts Chocolate and cocoa. Donuts. Seasonings and condiments Pepper. Peppermint and spearmint. Added salt. Any condiments, herbs, or seasonings that cause symptoms. For some people, this may include curry, hot sauce, or vinegar-based salad dressings. The items listed above may not be a complete list of foods and beverages to avoid. Contact a dietitian for more information. Questions to ask your health care provider Diet and lifestyle changes are usually the first steps that are taken to manage symptoms of GERD. If diet and lifestyle changes do not improve your symptoms, talk with your health care provider about taking medicines. Where to find more information International Foundation for Gastrointestinal Disorders: aboutgerd.org Summary When you have gastroesophageal reflux disease (GERD), food and lifestyle choices may be very helpful in easing the discomfort of GERD. Eat frequent, small meals instead of three large meals each day. Eat your meals slowly, in a relaxed setting. Avoid bending over or lying down until 2-3 hours after eating. Limit high-fat foods such as fatty meats or fried foods. This information is not intended to replace advice given to you by your health care provider. Make sure you discuss any questions you have with your health care provider. Document Revised: 08/30/2019 Document Reviewed: 08/30/2019 Elsevier Patient Education  2024 Elsevier  Inc.  Health Maintenance for Postmenopausal Women Menopause is a normal process in which your ability to get pregnant comes to an end. This process happens slowly over many months or years, usually between the ages of 35 and 16. Menopause is complete when you have missed your menstrual period for 12 months. It is important to  talk with your health care provider about some of the most common conditions that affect women after menopause (postmenopausal women). These include heart disease, cancer, and bone loss (osteoporosis). Adopting a healthy lifestyle and getting preventive care can help to promote your health and wellness. The actions you take can also lower your chances of developing some of these common conditions. What are the signs and symptoms of menopause? During menopause, you may have the following symptoms: Hot flashes. These can be moderate or severe. Night sweats. Decrease in sex drive. Mood swings. Headaches. Tiredness (fatigue). Irritability. Memory problems. Problems falling asleep or staying asleep. Talk with your health care provider about treatment options for your symptoms. Do I need hormone replacement therapy? Hormone replacement therapy is effective in treating symptoms that are caused by menopause, such as hot flashes and night sweats. Hormone replacement carries certain risks, especially as you become older. If you are thinking about using estrogen or estrogen with progestin, discuss the benefits and risks with your health care provider. How can I reduce my risk for heart disease and stroke? The risk of heart disease, heart attack, and stroke increases as you age. One of the causes may be a change in the body's hormones during menopause. This can affect how your body uses dietary fats, triglycerides, and cholesterol. Heart attack and stroke are medical emergencies. There are many things that you can do to help prevent heart disease and stroke. Watch your blood pressure High blood pressure causes heart disease and increases the risk of stroke. This is more likely to develop in people who have high blood pressure readings or are overweight. Have your blood pressure checked: Every 3-5 years if you are 27-33 years of age. Every year if you are 17 years old or older. Eat a healthy diet  Eat a  diet that includes plenty of vegetables, fruits, low-fat dairy products, and lean protein. Do not eat a lot of foods that are high in solid fats, added sugars, or sodium. Get regular exercise Get regular exercise. This is one of the most important things you can do for your health. Most adults should: Try to exercise for at least 150 minutes each week. The exercise should increase your heart rate and make you sweat (moderate-intensity exercise). Try to do strengthening exercises at least twice each week. Do these in addition to the moderate-intensity exercise. Spend less time sitting. Even light physical activity can be beneficial. Other tips Work with your health care provider to achieve or maintain a healthy weight. Do not use any products that contain nicotine or tobacco. These products include cigarettes, chewing tobacco, and vaping devices, such as e-cigarettes. If you need help quitting, ask your health care provider. Know your numbers. Ask your health care provider to check your cholesterol and your blood sugar (glucose). Continue to have your blood tested as directed by your health care provider. Do I need screening for cancer? Depending on your health history and family history, you may need to have cancer screenings at different stages of your life. This may include screening for: Breast cancer. Cervical cancer. Lung cancer. Colorectal cancer. What is my risk for osteoporosis? After menopause,  you may be at increased risk for osteoporosis. Osteoporosis is a condition in which bone destruction happens more quickly than new bone creation. To help prevent osteoporosis or the bone fractures that can happen because of osteoporosis, you may take the following actions: If you are 30-35 years old, get at least 1,000 mg of calcium and at least 600 international units (IU) of vitamin D per day. If you are older than age 24 but younger than age 24, get at least 1,200 mg of calcium and at least 600  international units (IU) of vitamin D per day. If you are older than age 66, get at least 1,200 mg of calcium and at least 800 international units (IU) of vitamin D per day. Smoking and drinking excessive alcohol increase the risk of osteoporosis. Eat foods that are rich in calcium and vitamin D, and do weight-bearing exercises several times each week as directed by your health care provider. How does menopause affect my mental health? Depression may occur at any age, but it is more common as you become older. Common symptoms of depression include: Feeling depressed. Changes in sleep patterns. Changes in appetite or eating patterns. Feeling an overall lack of motivation or enjoyment of activities that you previously enjoyed. Frequent crying spells. Talk with your health care provider if you think that you are experiencing any of these symptoms. General instructions See your health care provider for regular wellness exams and vaccines. This may include: Scheduling regular health, dental, and eye exams. Getting and maintaining your vaccines. These include: Influenza vaccine. Get this vaccine each year before the flu season begins. Pneumonia vaccine. Shingles vaccine. Tetanus, diphtheria, and pertussis (Tdap) booster vaccine. Your health care provider may also recommend other immunizations. Tell your health care provider if you have ever been abused or do not feel safe at home. Summary Menopause is a normal process in which your ability to get pregnant comes to an end. This condition causes hot flashes, night sweats, decreased interest in sex, mood swings, headaches, or lack of sleep. Treatment for this condition may include hormone replacement therapy. Take actions to keep yourself healthy, including exercising regularly, eating a healthy diet, watching your weight, and checking your blood pressure and blood sugar levels. Get screened for cancer and depression. Make sure that you are up to  date with all your vaccines. This information is not intended to replace advice given to you by your health care provider. Make sure you discuss any questions you have with your health care provider. Document Revised: 07/10/2020 Document Reviewed: 07/10/2020 Elsevier Patient Education  2024 [employer].

## 2023-02-13 NOTE — Assessment & Plan Note (Signed)
Clinical breast exam performed today.  Deferred pelvic exam the absence of pelvic complaints and Pap smear is up-to-date.

## 2023-02-13 NOTE — Assessment & Plan Note (Addendum)
No alarm features at this time.  Counseled on caffeine, alcohol and encouraged cessation to minimize symptoms. Continue 14 day trial nexium otc and advised cessation of Nexium after 14 days.  Advise she may use Nexium for exacerbations.

## 2023-02-13 NOTE — Progress Notes (Signed)
Assessment & Plan:  Routine physical examination Assessment & Plan: Clinical breast exam performed today.  Deferred pelvic exam the absence of pelvic complaints and Pap smear is up-to-date.   Gastroesophageal reflux disease, unspecified whether esophagitis present Assessment & Plan: No alarm features at this time.  Counseled on caffeine, alcohol and encouraged cessation to minimize symptoms. Continue 14 day trial nexium otc and advised cessation of Nexium after 14 days.  Advise she may use Nexium for exacerbations.    Encounter for lipid screening for cardiovascular disease -     Lipid panel; Future  Abnormal laboratory test -     CBC with Differential/Platelet; Future -     Comprehensive metabolic panel; Future  Elevated glucose -     Hemoglobin A1c; Future     Return precautions given.   Risks, benefits, and alternatives of the medications and treatment plan prescribed today were discussed, and patient expressed understanding.   Education regarding symptom management and diagnosis given to patient on AVS either electronically or printed.  Return for Fasting labs in 2-3 weeks, Complete Physical Exam.  Rennie Plowman, FNP  Subjective:    Patient ID: Sheryl Morrow, female    DOB: 16-Aug-1975, 47 y.o.   MRN: [id number]  CC: TAHIRI Morrow is a 47 y.o. female who presents today for physical exam.    HPI: Complains of epigastric pain for the past couple of weeks  Worse after tomato based foods.   Started nexium otc 3 days ago, with improvement.   No trouble swallowing, choking, vomiting  Drinks 15-20 ounce of coffee daily  No nsaids     Colorectal Cancer Screening: UTD with cologuard Breast Cancer Screening: Mammogram overdue; patient will schedule Cervical Cancer Screening: UTD, obtained 03/09/2020, negative malignancy, negative HPV  Bone Health screening/DEXA for 65+: No increased fracture risk. Defer screening at this time.  Lung Cancer Screening:  Doesn't have 20 year pack year history and age > 48 years yo 32 years        Tetanus - UTD       Exercise: Gets regular exercise.   Alcohol use: Occasional, 1 glass of wine a couple of nights per week.    Smoking/tobacco use: former smoker.    Health Maintenance  Topic Date Due   Flu Shot  06/02/2023*   Cologuard (Stool DNA test)  06/21/2023   Pap with HPV screening  03/09/2025   DTaP/Tdap/Td vaccine (2 - Td or Tdap) 08/07/2027   Hepatitis C Screening  Completed   HIV Screening  Completed   HPV Vaccine  Aged Out   COVID-19 Vaccine  Discontinued  *Topic was postponed. The date shown is not the original due date.    ALLERGIES: Patient has no known allergies.  Current Outpatient Medications on File Prior to Visit  Medication Sig Dispense Refill   clonazePAM (KLONOPIN) 0.5 MG tablet Take 1 tablet (0.5 mg total) by mouth daily as needed for anxiety. 15 tablet 0   esomeprazole (NEXIUM) 20 MG capsule Take 20 mg by mouth daily at 12 noon.     No current facility-administered medications on file prior to visit.    Review of Systems  Constitutional:  Negative for chills, fever and unexpected weight change.  HENT:  Negative for congestion and trouble swallowing.   Respiratory:  Negative for cough.   Cardiovascular:  Negative for chest pain, palpitations and leg swelling.  Gastrointestinal:  Positive for abdominal pain (epigastric pain). Negative for nausea and vomiting.  Musculoskeletal:  Negative for  arthralgias and myalgias.  Skin:  Negative for rash.  Neurological:  Negative for headaches.  Hematological:  Negative for adenopathy.  Psychiatric/Behavioral:  Negative for confusion.       Objective:    BP 128/78   Pulse 74   Temp 97.8 F (36.6 C) (Oral)   Ht 5\' 7"  (1.702 m)   Wt 213 lb 3.2 oz (96.7 kg)   LMP  (LMP Unknown)   SpO2 99%   BMI 33.39 kg/m   BP Readings from Last 3 Encounters:  02/13/23 128/78  09/11/22 122/76  08/30/22 118/78   Wt Readings from Last 3  Encounters:  02/13/23 213 lb 3.2 oz (96.7 kg)  09/11/22 204 lb 9.6 oz (92.8 kg)  08/30/22 206 lb 9.6 oz (93.7 kg)    Physical Exam Vitals reviewed.  Constitutional:      Appearance: Normal appearance. She is well-developed.  Eyes:     Conjunctiva/sclera: Conjunctivae normal.  Neck:     Thyroid: No thyroid mass or thyromegaly.  Cardiovascular:     Rate and Rhythm: Normal rate and regular rhythm.     Pulses: Normal pulses.     Heart sounds: Normal heart sounds.  Pulmonary:     Effort: Pulmonary effort is normal.     Breath sounds: Normal breath sounds. No wheezing, rhonchi or rales.  Chest:  Breasts:    Breasts are symmetrical.     Right: No inverted nipple, mass, nipple discharge, skin change or tenderness.     Left: No inverted nipple, mass, nipple discharge, skin change or tenderness.  Abdominal:     General: Bowel sounds are normal. There is no distension.     Palpations: Abdomen is soft. Abdomen is not rigid. There is no fluid wave or mass.     Tenderness: There is no abdominal tenderness. There is no guarding or rebound.  Lymphadenopathy:     Head:     Right side of head: No submental, submandibular, tonsillar, preauricular, posterior auricular or occipital adenopathy.     Left side of head: No submental, submandibular, tonsillar, preauricular, posterior auricular or occipital adenopathy.     Cervical: No cervical adenopathy.     Right cervical: No superficial, deep or posterior cervical adenopathy.    Left cervical: No superficial, deep or posterior cervical adenopathy.  Skin:    General: Skin is warm and dry.  Neurological:     Mental Status: She is alert.  Psychiatric:        Speech: Speech normal.        Behavior: Behavior normal.        Thought Content: Thought content normal.

## 2023-02-14 ENCOUNTER — Other Ambulatory Visit (INDEPENDENT_AMBULATORY_CARE_PROVIDER_SITE_OTHER): Payer: 59

## 2023-02-14 ENCOUNTER — Other Ambulatory Visit: Payer: 59

## 2023-02-14 DIAGNOSIS — Z136 Encounter for screening for cardiovascular disorders: Secondary | ICD-10-CM

## 2023-02-14 DIAGNOSIS — Z13220 Encounter for screening for lipoid disorders: Secondary | ICD-10-CM

## 2023-02-14 DIAGNOSIS — R7309 Other abnormal glucose: Secondary | ICD-10-CM

## 2023-02-14 DIAGNOSIS — R899 Unspecified abnormal finding in specimens from other organs, systems and tissues: Secondary | ICD-10-CM

## 2023-02-14 LAB — CBC WITH DIFFERENTIAL/PLATELET
Basophils Absolute: 0.1 10*3/uL (ref 0.0–0.1)
Basophils Relative: 0.9 % (ref 0.0–3.0)
Eosinophils Absolute: 0.1 10*3/uL (ref 0.0–0.7)
Eosinophils Relative: 2 % (ref 0.0–5.0)
HCT: 40.4 % (ref 36.0–46.0)
Hemoglobin: 13.4 g/dL (ref 12.0–15.0)
Lymphocytes Relative: 32.2 % (ref 12.0–46.0)
Lymphs Abs: 1.9 10*3/uL (ref 0.7–4.0)
MCHC: 33.2 g/dL (ref 30.0–36.0)
MCV: 98 fL (ref 78.0–100.0)
Monocytes Absolute: 0.6 10*3/uL (ref 0.1–1.0)
Monocytes Relative: 9.3 % (ref 3.0–12.0)
Neutro Abs: 3.3 10*3/uL (ref 1.4–7.7)
Neutrophils Relative %: 55.6 % (ref 43.0–77.0)
Platelets: 206 10*3/uL (ref 150.0–400.0)
RBC: 4.12 Mil/uL (ref 3.87–5.11)
RDW: 13.3 % (ref 11.5–15.5)
WBC: 6 10*3/uL (ref 4.0–10.5)

## 2023-02-14 LAB — COMPREHENSIVE METABOLIC PANEL
ALT: 18 U/L (ref 0–35)
AST: 24 U/L (ref 0–37)
Albumin: 4.5 g/dL (ref 3.5–5.2)
Alkaline Phosphatase: 53 U/L (ref 39–117)
BUN: 10 mg/dL (ref 6–23)
CO2: 28 meq/L (ref 19–32)
Calcium: 9 mg/dL (ref 8.4–10.5)
Chloride: 98 meq/L (ref 96–112)
Creatinine, Ser: 0.65 mg/dL (ref 0.40–1.20)
GFR: 104.1 mL/min (ref >60.00)
Glucose, Bld: 78 mg/dL (ref 70–99)
Potassium: 4.3 meq/L (ref 3.5–5.1)
Sodium: 136 meq/L (ref 135–145)
Total Bilirubin: 0.9 mg/dL (ref 0.2–1.2)
Total Protein: 6.9 g/dL (ref 6.0–8.3)

## 2023-02-14 LAB — LIPID PANEL
Cholesterol: 261 mg/dL — ABNORMAL HIGH (ref 0–200)
HDL: 104.5 mg/dL (ref >39.00)
LDL Cholesterol: 147 mg/dL — ABNORMAL HIGH (ref 0–99)
NonHDL: 156.44
Total CHOL/HDL Ratio: 2
Triglycerides: 47 mg/dL (ref 0.0–149.0)
VLDL: 9.4 mg/dL (ref 0.0–40.0)

## 2023-02-14 LAB — HEMOGLOBIN A1C: Hgb A1c MFr Bld: 5.4 % (ref 4.6–6.5)

## 2023-03-14 ENCOUNTER — Encounter: Payer: 59 | Admitting: Family

## 2023-05-05 ENCOUNTER — Ambulatory Visit
Admission: EM | Admit: 2023-05-05 | Discharge: 2023-05-05 | Disposition: A | Discharge status: Home / Self Care | Attending: Emergency Medicine | Admitting: Emergency Medicine

## 2023-05-05 ENCOUNTER — Encounter: Payer: Self-pay | Admitting: Emergency Medicine

## 2023-05-05 DIAGNOSIS — R21 Rash and other nonspecific skin eruption: Secondary | ICD-10-CM

## 2023-05-05 DIAGNOSIS — R030 Elevated blood-pressure reading, without diagnosis of hypertension: Secondary | ICD-10-CM

## 2023-05-05 MED ORDER — METHYLPREDNISOLONE 4 MG PO TBPK: ORAL_TABLET | ORAL | 0 refills | Status: DC

## 2023-05-05 NOTE — ED Provider Notes (Signed)
 [facility]    CSN: [id number] Arrival date & time: 05/05/23  1456      History   Chief Complaint Chief Complaint  Patient presents with   Rash    HPI LANAE FEDERER is a 48 y.o. female.   48 year old female, Saavi Mceachron, presents to urgent care for evaluation of rash on her chest and abdomen for 5 days; States she used a new body wash recently prior to rash, patient also picked up a chicken in a towel that may have lice, used some unknown topical medicine that possibly helped with symptoms but does not know the name of it.  Since husband does not have rash.  The history is provided by the patient. No language interpreter was used.    Past Medical History:  Diagnosis Date   Hemorrhoids     Patient Active Problem List   Diagnosis Date Noted   Rash and nonspecific skin eruption 05/05/2023   Peritonsillar abscess, with dysphagia and odynophagia 08/26/2022   Dysphagia, oropharyngeal 08/26/2022   Acute conjunctivitis of right eye 03/20/2022   Low back pain 12/15/2020   HTN (hypertension) 04/12/2020   Screen for colon cancer 04/12/2020   Elevated blood pressure reading 03/09/2020   Rectal bleeding 08/06/2017   Weight gain 08/06/2017   Elevated cholesterol 08/06/2017   Exposure to hepatitis C 08/06/2017   Abnormal CBC 08/06/2017   Vitamin D deficiency 08/06/2017   Anxiety 07/25/2016   Hemorrhoids 07/25/2016   GERD (gastroesophageal reflux disease) 07/25/2016   Routine physical examination 02/08/2016   Viral pharyngitis 02/08/2016   Preventative health care 07/24/2015    Past Surgical History:  Procedure Laterality Date   CESAREAN SECTION  09/2008   HERNIA REPAIR     embilical   UMBILICAL HERNIA REPAIR  12/2007    OB History   No obstetric history on file.      Home Medications    Prior to Admission medications   Medication Sig Start Date End Date Taking? Authorizing Provider  methylPREDNISolone (MEDROL DOSEPAK) 4 MG TBPK tablet  Take as package directed 05/05/23  Yes Kateria Cutrona, Para March, NP  clonazePAM (KLONOPIN) 0.5 MG tablet Take 1 tablet (0.5 mg total) by mouth daily as needed for anxiety. 09/11/22   [name], FNP  esomeprazole (NEXIUM) 20 MG capsule Take 20 mg by mouth daily at 12 noon.    [provider]    Family History Family History  Problem Relation Age of Onset   Heart disease Father        Bypass   Hypertension Father    Diabetes Paternal Grandmother    High Cholesterol Mother    Breast cancer Neg Hx    Colon cancer Neg Hx     Social History Social History   Tobacco Use   Smoking status: Former    Types: Cigarettes    Start date: 03/05/2011   Smokeless tobacco: Never   Tobacco comments:    Quit 2014  Substance Use Topics   Alcohol use: Yes    Alcohol/week: 0.0 standard drinks of alcohol    Comment: Moderate use   Drug use: No     Allergies   Patient has no known allergies.   Review of Systems Review of Systems  Constitutional:  Negative for fever.  Respiratory:  Negative for shortness of breath.   Skin:  Positive for rash.       Itching  All other systems reviewed and are negative.    Physical Exam  Triage Vital Signs ED Triage Vitals [05/05/23 1511]  Encounter Vitals Group     BP      Systolic BP Percentile      Diastolic BP Percentile      Pulse      Resp      Temp      Temp src      SpO2      Weight      Height      Head Circumference      Peak Flow      Pain Score 0     Pain Loc      Pain Education      Exclude from Growth Chart    No data found.  Updated Vital Signs BP (!) 160/100 (BP Location: Left Arm)   Pulse 71   Temp 97.8 F (36.6 C) (Oral)   Resp 18   LMP 04/17/2023   SpO2 100%   Visual Acuity Right Eye Distance:   Left Eye Distance:   Bilateral Distance:    Right Eye Near:   Left Eye Near:    Bilateral Near:     Physical Exam Vitals and nursing note reviewed.  Constitutional:      Appearance: She is well-developed  and well-groomed.  HENT:     Head: Normocephalic.  Cardiovascular:     Rate and Rhythm: Normal rate.  Pulmonary:     Effort: Pulmonary effort is normal.  Skin:    General: Skin is warm.     Capillary Refill: Capillary refill takes less than 2 seconds.     Findings: Rash present. Rash is macular and papular. Rash is not crusting, nodular, purpuric, pustular, scaling, urticarial or vesicular.     Comments: Rash encompasses anterior and posterior trunk extremity and legs  Neurological:     General: No focal deficit present.     Mental Status: She is alert and oriented to person, place, and time.     GCS: GCS eye subscore is 4. GCS verbal subscore is 5. GCS motor subscore is 6.  Psychiatric:        Mood and Affect: Mood is anxious.        Behavior: Behavior is cooperative.      UC Treatments / Results  Labs (all labs ordered are listed, but only abnormal results are displayed) Labs Reviewed - No data to display  EKG   Radiology No results found.  Procedures Procedures (including critical care time)  Medications Ordered in UC Medications - No data to display  Initial Impression / Assessment and Plan / UC Course  I have reviewed the triage vital signs and the nursing notes.  Pertinent labs & imaging results that were available during my care of the patient were reviewed by me and considered in my medical decision making (see chart for details).    Discussed exam findings and plan of care with patient, requesting steroid prescription but not sure if she will take it due to side effects , over-the-counter options discussed, strict go to ER precautions given.   Patient verbalized understanding to this provider.  Ddx: Rash, contact dermatitis, mite infestation, scabies Final Clinical Impressions(s) / UC Diagnoses   Final diagnoses:  Rash and nonspecific skin eruption  Elevated blood pressure reading     Discharge Instructions      Avoid heat ,hot water as it makes  rashes worse. Take meds as directed(medrol dosepak).  May use over-the-counter allergy medicine as label directed (Zyrtec , Claritin, Benadryl ),  may use Aveena oatmeal bath,calamine lotion; follow up with PCP. Go to Er for new or worsening issues.     ED Prescriptions     Medication Sig Dispense Auth. Provider   methylPREDNISolone (MEDROL DOSEPAK) 4 MG TBPK tablet Take as package directed 21 tablet Nini Cavan, Para March, NP      PDMP not reviewed this encounter.   Clancy Gourd, NP 05/05/23 2035

## 2023-05-05 NOTE — Discharge Instructions (Addendum)
 Avoid heat ,hot water as it makes rashes worse. Take meds as directed(medrol dosepak).  May use over-the-counter allergy medicine as label directed (Zyrtec , Claritin, Benadryl ), may use Aveena oatmeal bath,calamine lotion; follow up with PCP. Go to Er for new or worsening issues.

## 2023-05-05 NOTE — ED Triage Notes (Addendum)
 Patient presents with rash on her chest and abdomen x 5 days.

## 2023-05-08 ENCOUNTER — Telehealth: Payer: Self-pay | Admitting: Internal Medicine

## 2023-05-08 ENCOUNTER — Telehealth: Payer: Self-pay

## 2023-05-08 MED ORDER — TRIAMCINOLONE ACETONIDE 0.025 % EX OINT: 1.0000 | TOPICAL_OINTMENT | Freq: Two times a day (BID) | CUTANEOUS | 0 refills | Status: DC

## 2023-05-08 MED ORDER — TRIAMCINOLONE ACETONIDE 0.025 % EX OINT: 1.0000 | TOPICAL_OINTMENT | Freq: Two times a day (BID) | CUTANEOUS | 0 refills | Status: AC

## 2023-05-08 NOTE — Telephone Encounter (Signed)
 Received call from pt about resending [facility]int[facility] [address] [facility] [place]. Ok'd by Nettie Elm PA. [facility] [place] [facility].

## 2023-05-08 NOTE — Telephone Encounter (Signed)
 She called requesting topical.  I reviewed her note from Monday and since it was just chest and abdomen, I sent triamcinolone cream 0.025%

## 2023-06-09 ENCOUNTER — Other Ambulatory Visit: Payer: Self-pay | Admitting: Family

## 2023-06-09 DIAGNOSIS — F419 Anxiety disorder, unspecified: Secondary | ICD-10-CM

## 2023-06-27 ENCOUNTER — Encounter: Payer: Self-pay | Admitting: Family

## 2023-06-27 ENCOUNTER — Ambulatory Visit (INDEPENDENT_AMBULATORY_CARE_PROVIDER_SITE_OTHER): Admitting: Family

## 2023-06-27 VITALS — BP 130/70 | HR 87 | Temp 97.8°F | Ht 67.0 in | Wt 203.4 lb

## 2023-06-27 DIAGNOSIS — Z13220 Encounter for screening for lipoid disorders: Secondary | ICD-10-CM | POA: Diagnosis not present

## 2023-06-27 DIAGNOSIS — K649 Unspecified hemorrhoids: Secondary | ICD-10-CM | POA: Diagnosis not present

## 2023-06-27 DIAGNOSIS — Z1211 Encounter for screening for malignant neoplasm of colon: Secondary | ICD-10-CM | POA: Diagnosis not present

## 2023-06-27 DIAGNOSIS — I10 Essential (primary) hypertension: Secondary | ICD-10-CM

## 2023-06-27 DIAGNOSIS — Z136 Encounter for screening for cardiovascular disorders: Secondary | ICD-10-CM

## 2023-06-27 MED ORDER — HYDROCORTISONE (PERIANAL) 2.5 % EX CREA: 1.0000 | TOPICAL_CREAM | Freq: Two times a day (BID) | CUTANEOUS | 0 refills | Status: AC

## 2023-06-27 NOTE — Progress Notes (Signed)
 Assessment & Plan:  Hemorrhoids, unspecified hemorrhoid type Assessment & Plan: Asymptomatic at this time.  Counseled on prevention of constipation, straining.  Provided Anusol  for prn use.   Orders: -     Hydrocortisone  (Perianal); Place 1 Application rectally 2 (two) times daily.  Dispense: 30 g; Refill: 0  Screening for colon cancer -     Cologuard  Encounter for lipid screening for cardiovascular disease -     Hemoglobin A1c; Future -     Lipid panel; Future  Hypertension, unspecified type -     Hemoglobin A1c; Future -     Lipid panel; Future  Screen for colon cancer Assessment & Plan: Advised to bring sigmoidoscopy report from [place]  to the office to be scanned.  Advised if she has a history of colon polyps that Cologuard would be inappropriate and colonoscopy would be a much better test.  She will let me know.      Return precautions given.   Risks, benefits, and alternatives of the medications and treatment plan prescribed today were discussed, and patient expressed understanding.   Education regarding symptom management and diagnosis given to patient on AVS either electronically or printed.  Return for Complete Physical Exam, Fasting labs in 2-3 weeks.  Bascom Bossier, FNP  Subjective:    Patient ID: Brinda Canary, female    DOB: Jul 25, 1975, 48 y.o.   MRN: [id number]  CC: KATTIA SELLEY is a 48 y.o. female who presents today for follow up.   HPI: She has lost weight intentionally by following a lower carb diet  She is pleased with weight loss. She has not had to use the nexium with dietary changes.   H/o hemorrhoids.  She will have episodic rectal pain. Last episode 2 months ago, resolved.  She can feel internal and external hemorrhoid.   No stool incontinence, rectal bleeding, constipation     Report sigmoidoscopy in [place]  in which hemorrhoids were identified.  No known history of colon polyps.  No family history of colon  cancer   Allergies: Patient has no known allergies. Current Outpatient Medications on File Prior to Visit  Medication Sig Dispense Refill   clonazePAM  (KLONOPIN ) 0.5 MG tablet TAKE 1 TABLET BY MOUTH EVERY DAY AS NEEDED FOR ANXIETY 15 tablet 1   triamcinolone  (KENALOG ) 0.025 % ointment Apply 1 Application topically 2 (two) times daily. For 7 days, then prn (Patient not taking: Reported on 06/27/2023) 80 g 0   No current facility-administered medications on file prior to visit.    Review of Systems  Constitutional:  Negative for chills and fever.  Respiratory:  Negative for cough.   Cardiovascular:  Negative for chest pain and palpitations.  Gastrointestinal:  Negative for anal bleeding, blood in stool, nausea, rectal pain and vomiting.      Objective:    BP 130/70   Pulse 87   Temp 97.8 F (36.6 C) (Oral)   Ht 5\' 7"  (1.702 m)   Wt 203 lb 6.4 oz (92.3 kg)   SpO2 99%   BMI 31.86 kg/m  BP Readings from Last 3 Encounters:  06/27/23 130/70  05/05/23 (!) 160/100  02/13/23 128/78   Wt Readings from Last 3 Encounters:  06/27/23 203 lb 6.4 oz (92.3 kg)  02/13/23 213 lb 3.2 oz (96.7 kg)  09/11/22 204 lb 9.6 oz (92.8 kg)    Physical Exam Vitals reviewed.  Constitutional:      Appearance: She is well-developed.  Eyes:     Conjunctiva/sclera: Conjunctivae normal.  Cardiovascular:     Rate and Rhythm: Normal rate and regular rhythm.     Pulses: Normal pulses.     Heart sounds: Normal heart sounds.  Pulmonary:     Effort: Pulmonary effort is normal.     Breath sounds: Normal breath sounds. No wheezing, rhonchi or rales.  Skin:    General: Skin is warm and dry.  Neurological:     Mental Status: She is alert.  Psychiatric:        Speech: Speech normal.        Behavior: Behavior normal.        Thought Content: Thought content normal.

## 2023-06-27 NOTE — Patient Instructions (Signed)
 We have ordered Cologuard test to screen for colon cancer as discussed today.  This test is intended for low risk patients only.  If you have colon polyps on your last colonoscopy, I would strongly advise having a colonoscopy instead of Cologuard.  Please drop off report when you can.  I prescribed Anusol  which is a topical steroid to be used for hemorrhoid.  Please let me know if hemorrhoids were to become painful so that we can perform a physical exam.    Hemorrhoids Hemorrhoids are swollen veins in and around the rectum or the opening of the butt (anus). There are two types of hemorrhoids: Internal. These occur in the veins just inside the rectum. They may poke through to the outside and become irritated and painful. External. These occur in the veins outside the anus. They can be felt as a painful swelling or hard lump near the anus. Most hemorrhoids do not cause severe problems. Often, they can be treated at home with diet and lifestyle changes. If home treatments do not help, you may need a procedure to shrink or remove the hemorrhoids. What are the causes? Hemorrhoids are caused by pressure near the anus. This pressure may be caused by: Constipation or diarrhea. Straining to poop. Pregnancy. Obesity. Sitting or riding a bike for a long time. Heavy lifting or other things that cause you to strain. Anal sex. What are the signs or symptoms? Symptoms of this condition include: Pain. Anal itching or irritation. Bleeding from the rectum. Leakage of poop (stool). Swelling of the anus. One or more lumps around the anus. How is this diagnosed? Hemorrhoids can often be diagnosed through a visual exam. Other exams or tests may also be done, such as: A digital rectal exam. This is when your health care provider feels inside your rectum with a gloved finger. Anoscope. This is an exam of the anus using a small tube. A blood test, if you have lost a lot of blood. A sigmoidoscopy or  colonoscopy. These are tests to look inside the colon using a tube with a camera on the end. How is this treated? In most cases, hemorrhoids can be treated at home with diet and lifestyle changes. If these changes do not help, you may need to have a procedure done. These procedures can make the hemorrhoids smaller or fully remove them. Common procedures include: Rubber band ligation. Rubber bands are placed at the base of the hemorrhoids to cut off their blood supply. Sclerotherapy. Medicine is put into the hemorrhoids to shrink them. Infrared coagulation. A type of light energy is used to get rid of the hemorrhoids. Hemorrhoidectomy surgery. The hemorrhoids are removed during surgery. Then, the veins that supply them are tied off. Stapled hemorrhoidopexy surgery. The base of the hemorrhoid is stapled to the wall of the rectum. Follow these instructions at home: Medicines Take over-the-counter and prescription medicines only as told by your provider. Use medicated creams or medicines that are put in the rectum (suppositories) as told by your provider. Eating and drinking  Eat foods that are high in fiber, such as beans, whole grains, and fresh fruits and vegetables. Ask your provider about taking products that have fiber added to them (fiber supplements). Reduce the amount of fat in your diet. You can do this by eating low-fat dairy products, eating less red meat, and avoiding processed foods. Drink enough fluid to keep your pee (urine) pale yellow. Managing pain and swelling  Take warm sitz baths for 20 minutes, 3-4  times a day. This can help ease pain and discomfort. You may do this in a bathtub or you can use a portable sitz bath that fits over the toilet. If told, put ice on the affected area. It may help to use ice packs between sitz baths. Put ice in a plastic bag. Place a towel between your skin and the bag. Leave the ice on for 20 minutes, 2-3 times a day. If your skin turns bright  red, remove the ice right away to prevent skin damage. The risk of damage is higher if you cannot feel pain, heat, or cold. General instructions Exercise. Ask your provider how much and what kind of exercise is best for you. In general, you should do moderate exercise for at least 30 minutes on most days of the week (150 minutes each week). You may want to try walking, biking, or yoga. Go to the bathroom when you have the urge to poop. Do not wait. Avoid straining to poop. Keep the anus dry and clean. Use wet toilet paper or moist towelettes after you poop. Do not sit on the toilet for a long time. This can increase blood pooling and pain. Where to find more information [employer] and Digestive [employer] Kidney Diseases: [employer] Contact a health care provider if: You have more pain and swelling that do not get better with treatment. You have trouble pooping or you are not able to poop. You have pain or inflammation outside the area of the hemorrhoids. Get help right away if: You are bleeding from your rectum and you cannot get it to stop. This information is not intended to replace advice given to you by your health care provider. Make sure you discuss any questions you have with your health care provider. Document Revised: 10/31/2021 Document Reviewed: 10/31/2021 Elsevier Patient Education  2024 [employer].

## 2023-06-27 NOTE — Assessment & Plan Note (Signed)
 Advised to bring sigmoidoscopy report from [place]  to the office to be scanned.  Advised if she has a history of colon polyps that Cologuard would be inappropriate and colonoscopy would be a much better test.  She will let me know.

## 2023-06-27 NOTE — Assessment & Plan Note (Signed)
 Asymptomatic at this time.  Counseled on prevention of constipation, straining.  Provided Anusol  for prn use.

## 2023-07-03 ENCOUNTER — Other Ambulatory Visit

## 2023-07-03 DIAGNOSIS — I10 Essential (primary) hypertension: Secondary | ICD-10-CM | POA: Diagnosis not present

## 2023-07-03 DIAGNOSIS — Z13220 Encounter for screening for lipoid disorders: Secondary | ICD-10-CM | POA: Diagnosis not present

## 2023-07-03 DIAGNOSIS — Z136 Encounter for screening for cardiovascular disorders: Secondary | ICD-10-CM

## 2023-07-03 LAB — HEMOGLOBIN A1C: Hgb A1c MFr Bld: 5.4 % (ref 4.6–6.5)

## 2023-07-03 LAB — LIPID PANEL
Cholesterol: 248 mg/dL — ABNORMAL HIGH (ref 0–200)
HDL: 106.2 mg/dL (ref >39.00)
LDL Cholesterol: 132 mg/dL — ABNORMAL HIGH (ref 0–99)
NonHDL: 141.57
Total CHOL/HDL Ratio: 2
Triglycerides: 49 mg/dL (ref 0.0–149.0)
VLDL: 9.8 mg/dL (ref 0.0–40.0)

## 2023-07-10 ENCOUNTER — Encounter: Payer: Self-pay | Admitting: Family

## 2023-07-18 ENCOUNTER — Ambulatory Visit: Payer: Self-pay

## 2023-07-18 NOTE — Telephone Encounter (Signed)
 LVM to call back to inform pt of results below  Sheryl Morrow,   LDL cholesterol has improved from prior.  You are overall low cardiovascular risk.  Your HDL cholesterol remains excellent.         Regards,  Daivd Dub

## 2023-07-22 LAB — COLOGUARD: COLOGUARD: NEGATIVE

## 2023-07-22 NOTE — Telephone Encounter (Signed)
 Left message to call the office back regarding the lab results below. Okay to give lab results.

## 2023-07-22 NOTE — Telephone Encounter (Signed)
-----   Message from Bascom Bossier sent at 07/17/2023  2:39 PM EDT ----- Call patient Patient has not viewed MyChart result note.  Please review my chart note in detail with patient.   Please let me know if questions

## 2023-10-06 ENCOUNTER — Ambulatory Visit
Admission: EM | Admit: 2023-10-06 | Discharge: 2023-10-06 | Disposition: A | Discharge status: Home / Self Care | Attending: Emergency Medicine | Admitting: Emergency Medicine

## 2023-10-06 ENCOUNTER — Encounter: Payer: Self-pay | Admitting: Family

## 2023-10-06 DIAGNOSIS — H01135 Eczematous dermatitis of left lower eyelid: Secondary | ICD-10-CM | POA: Diagnosis not present

## 2023-10-06 DIAGNOSIS — H01131 Eczematous dermatitis of right upper eyelid: Secondary | ICD-10-CM

## 2023-10-06 DIAGNOSIS — H01134 Eczematous dermatitis of left upper eyelid: Secondary | ICD-10-CM | POA: Diagnosis not present

## 2023-10-06 DIAGNOSIS — H01132 Eczematous dermatitis of right lower eyelid: Secondary | ICD-10-CM | POA: Diagnosis not present

## 2023-10-06 MED ORDER — HYDROCORTISONE 0.5 % EX CREA: 1.0000 | TOPICAL_CREAM | Freq: Two times a day (BID) | CUTANEOUS | 0 refills | Status: DC

## 2023-10-06 NOTE — Discharge Instructions (Signed)
 Apply the hydrocortisone  0.5% cream twice daily to your eyelids to help with the rash and irritation.  Do not use the cream for longer than 1 week.  Make sure that you apply it with a clean Q-tip each time.  Avoid getting the hydrocortisone  in your eye directly.  [facility] and to discuss ongoing treatment options.

## 2023-10-06 NOTE — ED Provider Notes (Signed)
 [facility]    CSN: [id number] Arrival date & time: 10/06/23  1516      History   Chief Complaint Chief Complaint  Patient presents with   Rash    HPI Sheryl Morrow is a 48 y.o. female.   HPI  48 year old female with past medical history significant for hypertension, low back pain, GERD, anxiety, and vitamin D  deficiency presents for evaluation of a rash around her eyes that has been present for 2 months.  She reports that it is erythematous with flaky skin and has become slightly painful.  She denies itching.  Past Medical History:  Diagnosis Date   Hemorrhoids     Patient Active Problem List   Diagnosis Date Noted   Rash and nonspecific skin eruption 05/05/2023   Peritonsillar abscess, with dysphagia and odynophagia 08/26/2022   Dysphagia, oropharyngeal 08/26/2022   Acute conjunctivitis of right eye 03/20/2022   Low back pain 12/15/2020   HTN (hypertension) 04/12/2020   Screen for colon cancer 04/12/2020   Elevated blood pressure reading 03/09/2020   Rectal bleeding 08/06/2017   Weight gain 08/06/2017   Elevated cholesterol 08/06/2017   Exposure to hepatitis C 08/06/2017   Abnormal CBC 08/06/2017   Vitamin D  deficiency 08/06/2017   Anxiety 07/25/2016   Hemorrhoid 07/25/2016   GERD (gastroesophageal reflux disease) 07/25/2016   Routine physical examination 02/08/2016   Viral pharyngitis 02/08/2016   Preventative health care 07/24/2015    Past Surgical History:  Procedure Laterality Date   CESAREAN SECTION  09/2008   HERNIA REPAIR     embilical   UMBILICAL HERNIA REPAIR  12/2007    OB History   No obstetric history on file.      Home Medications    Prior to Admission medications   Medication Sig Start Date End Date Taking? Authorizing Provider  hydrocortisone  cream 0.5 % Apply 1 Application topically 2 (two) times daily. 10/06/23  Yes Bernardino Ditch, NP  clonazePAM  (KLONOPIN ) 0.5 MG tablet TAKE 1 TABLET BY MOUTH EVERY DAY AS NEEDED  FOR ANXIETY 06/10/23   Dineen Rollene MATSU, FNP  hydrocortisone  (ANUSOL -HC) 2.5 % rectal cream Place 1 Application rectally 2 (two) times daily. 06/27/23   Dineen Rollene MATSU, FNP  triamcinolone  (KENALOG ) 0.025 % ointment Apply 1 Application topically 2 (two) times daily. For 7 days, then prn Patient not taking: Reported on 06/27/2023 05/08/23   Rodriguez-Southworth, Kyra, PA-C    Family History Family History  Problem Relation Age of Onset   Heart disease Father        Bypass   Hypertension Father    Diabetes Paternal Grandmother    High Cholesterol Mother    Breast cancer Neg Hx    Colon cancer Neg Hx     Social History Social History   Tobacco Use   Smoking status: Former    Types: Cigarettes    Start date: 03/05/2011   Smokeless tobacco: Never   Tobacco comments:    Quit 2014  Substance Use Topics   Alcohol use: Yes    Alcohol/week: 0.0 standard drinks of alcohol    Comment: Moderate use   Drug use: No     Allergies   Patient has no known allergies.   Review of Systems Review of Systems  Skin:  Positive for rash.     Physical Exam Triage Vital Signs ED Triage Vitals  Encounter Vitals Group     BP      Girls Systolic BP Percentile  Girls Diastolic BP Percentile      Boys Systolic BP Percentile      Boys Diastolic BP Percentile      Pulse      Resp      Temp      Temp src      SpO2      Weight      Height      Head Circumference      Peak Flow      Pain Score      Pain Loc      Pain Education      Exclude from Growth Chart    No data found.  Updated Vital Signs BP (!) 151/101 (BP Location: Right Arm)   Pulse 76   Temp 99.1 F (37.3 C) (Oral)   Resp 18   SpO2 98%   Visual Acuity Right Eye Distance:   Left Eye Distance:   Bilateral Distance:    Right Eye Near:   Left Eye Near:    Bilateral Near:     Physical Exam Vitals and nursing note reviewed.  Constitutional:      Appearance: Normal appearance. She is not ill-appearing.   HENT:     Head: Normocephalic and atraumatic.  Eyes:     Extraocular Movements: Extraocular movements intact.     Conjunctiva/sclera: Conjunctivae normal.     Pupils: Pupils are equal, round, and reactive to light.  Skin:    General: Skin is warm and dry.     Capillary Refill: Capillary refill takes less than 2 seconds.  Neurological:     General: No focal deficit present.     Mental Status: She is alert and oriented to person, place, and time.      UC Treatments / Results  Labs (all labs ordered are listed, but only abnormal results are displayed) Labs Reviewed - No data to display  EKG   Radiology No results found.  Procedures Procedures (including critical care time)  Medications Ordered in UC Medications - No data to display  Initial Impression / Assessment and Plan / UC Course  I have reviewed the triage vital signs and the nursing notes.  Pertinent labs & imaging results that were available during my care of the patient were reviewed by me and considered in my medical decision making (see chart for details).   Patient is a pleasant, nontoxic-appearing 48 year old female presenting for evaluation of a flaky, tender rash on the eyelids of both eyes that still present for the last 2 months.  As you can see in image above, the rash is along the eyelash margin mainly.  There is no induration, edema, or fluctuance.  The rash resembles eczema.  I will treat the patient with 0.5% hydrocortisone  cream twice daily for the next week to help calm down the inflammation and help with the irritation.  I will also refer the patient to dermatology for evaluation.   Final Clinical Impressions(s) / UC Diagnoses   Final diagnoses:  Eczematous dermatitis of upper and lower eyelids of both eyes     Discharge Instructions      Apply the hydrocortisone  0.5% cream twice daily to your eyelids to help with the rash and irritation.  Do not use the cream for longer than 1  week.  Make sure that you apply it with a clean Q-tip each time.  Avoid getting the hydrocortisone  in your eye directly.  [facility] and to  discuss ongoing treatment options.       ED Prescriptions     Medication Sig Dispense Auth. Provider   hydrocortisone  cream 0.5 % Apply 1 Application topically 2 (two) times daily. 30 g Bernardino Ditch, NP      PDMP not reviewed this encounter.   Bernardino Ditch, NP 10/06/23 [phone]

## 2023-10-06 NOTE — Telephone Encounter (Signed)
 Called pt and she stated that she will go to UC, and have it looked at today.

## 2023-10-06 NOTE — ED Triage Notes (Signed)
 Rash around eye lid for over a month. Patient states that she thought it was getting better.

## 2023-10-09 ENCOUNTER — Ambulatory Visit (INDEPENDENT_AMBULATORY_CARE_PROVIDER_SITE_OTHER): Admitting: Family

## 2023-10-09 ENCOUNTER — Encounter: Payer: Self-pay | Admitting: Family

## 2023-10-09 VITALS — BP 136/76 | HR 80 | Temp 98.0°F | Ht 67.0 in | Wt 203.2 lb

## 2023-10-09 DIAGNOSIS — R21 Rash and other nonspecific skin eruption: Secondary | ICD-10-CM | POA: Diagnosis not present

## 2023-10-09 MED ORDER — HYDROCORTISONE 2.5 % EX OINT: TOPICAL_OINTMENT | Freq: Two times a day (BID) | CUTANEOUS | 0 refills | Status: AC

## 2023-10-09 MED ORDER — ERYTHROMYCIN 5 MG/GM OP OINT: TOPICAL_OINTMENT | OPHTHALMIC | 0 refills | Status: AC

## 2023-10-09 NOTE — Assessment & Plan Note (Signed)
 Extraocular rash. No systemic features D/D includes atopic dermatitis, psoriasis. Increased hydrocortisone  potency to 2.5%; counseled on strict avoidence of internal eye and to use < 7 days d/t skin thinning, hypopigmentation.  Provided erythromycin  ointment for potential bacterial component/bacterial load from eyelashes and  blepharitis. Referral to dermatology in place.

## 2023-10-09 NOTE — Progress Notes (Unsigned)
 Assessment & Plan:  Rash Assessment & Plan: Extraocular rash. No systemic features D/D includes atopic dermatitis, psoriasis. Increased hydrocortisone  potency to 2.5%; counseled on strict avoidence of internal eye and to use < 7 days d/t skin thinning, hypopigmentation.  Provided erythromycin  ointment for potential bacterial component/bacterial load from eyelashes and  blepharitis. Referral to dermatology in place.   Orders: -     Ambulatory referral to Dermatology -     Erythromycin ; Use one half inch four times daily to affected eye (s) x 7 days.  Dispense: 3.5 g; Refill: 0 -     Hydrocortisone ; Apply topically 2 (two) times daily. For less than 7 days . Do not put in eye.  Dispense: 30 g; Refill: 0     Return precautions given.   Risks, benefits, and alternatives of the medications and treatment plan prescribed today were discussed, and patient expressed understanding.   Education regarding symptom management and diagnosis given to patient on AVS either electronically or printed.  No follow-ups on file.  Rollene Northern, FNP  Subjective:    Patient ID: Sheryl Morrow, female    DOB: 1975-10-25, 48 y.o.   MRN: [id number]  CC: Sheryl Morrow is a 47 y.o. female who presents today for an acute visit.    HPI: HPI She complains of surrounding skin around the eyes being itchy x 8 weeks. Painful at times. Pain improved today. Rash is not itchy  No new lotion or cream.    Denies vision changes, eye discharge.   No h/o allergies   She notes that she adopted a cat June 5th.   Seen UC 10/06/23 for eye complaints of flaky skin, slight painful around the eyes Provided 0.5 % hydrocortisone  BID which she has used x 3 days with potentially some improvement  She has similar rash around her   Allergies: Patient has no known allergies. Current Outpatient Medications on File Prior to Visit  Medication Sig Dispense Refill   clonazePAM  (KLONOPIN ) 0.5 MG tablet TAKE 1 TABLET BY  MOUTH EVERY DAY AS NEEDED FOR ANXIETY 15 tablet 1   hydrocortisone  (ANUSOL -HC) 2.5 % rectal cream Place 1 Application rectally 2 (two) times daily. 30 g 0   triamcinolone  (KENALOG ) 0.025 % ointment Apply 1 Application topically 2 (two) times daily. For 7 days, then prn 80 g 0   No current facility-administered medications on file prior to visit.    Review of Systems  Constitutional:  Negative for chills and fever.  Eyes:  Negative for photophobia, pain, discharge, redness, itching and visual disturbance.  Respiratory:  Negative for cough.   Cardiovascular:  Negative for chest pain and palpitations.  Gastrointestinal:  Negative for nausea and vomiting.  Skin:  Positive for rash.  Neurological:  Negative for headaches.      Objective:    BP 136/76 (BP Location: Left Arm, Patient Position: Sitting, Cuff Size: Normal)   Pulse 80   Temp 98 F (36.7 C) (Oral)   Ht 5' 7 (1.702 m)   Wt 203 lb 3.2 oz (92.2 kg)   SpO2 97%   BMI 31.83 kg/m   BP Readings from Last 3 Encounters:  10/09/23 136/76  10/06/23 (!) 151/101  06/27/23 130/70   Wt Readings from Last 3 Encounters:  10/09/23 203 lb 3.2 oz (92.2 kg)  06/27/23 203 lb 6.4 oz (92.3 kg)  02/13/23 213 lb 3.2 oz (96.7 kg)    Physical Exam Vitals reviewed.  Constitutional:      Appearance: She is  well-developed.  Eyes:     General: Vision grossly intact.        Right eye: No foreign body, discharge or hordeolum.        Left eye: No foreign body, discharge or hordeolum.     Extraocular Movements: Extraocular movements intact.     Right eye: Normal extraocular motion.     Left eye: Normal extraocular motion.     Conjunctiva/sclera: Conjunctivae normal.     Right eye: Right conjunctiva is not injected. No exudate or hemorrhage.    Left eye: Left conjunctiva is not injected. No exudate or hemorrhage.    Comments: Flakey crusty lesions, skin cracking surrounding under eye and seen in eyelashes. No purulent discharge, vesicular  lesions.   Cardiovascular:     Rate and Rhythm: Normal rate and regular rhythm.     Pulses: Normal pulses.     Heart sounds: Normal heart sounds.  Pulmonary:     Effort: Pulmonary effort is normal.     Breath sounds: Normal breath sounds. No wheezing, rhonchi or rales.  Skin:    General: Skin is warm and dry.  Neurological:     Mental Status: She is alert.  Psychiatric:        Speech: Speech normal.        Behavior: Behavior normal.        Thought Content: Thought content normal.

## 2023-10-10 NOTE — Patient Instructions (Signed)
 Please see assessment and plan note.

## 2023-10-15 ENCOUNTER — Ambulatory Visit: Admitting: Dermatology

## 2023-10-30 ENCOUNTER — Ambulatory Visit: Admitting: Family

## 2024-04-07 ENCOUNTER — Other Ambulatory Visit: Payer: Self-pay | Admitting: Family

## 2024-04-07 DIAGNOSIS — F419 Anxiety disorder, unspecified: Secondary | ICD-10-CM

## 2024-04-28 ENCOUNTER — Ambulatory Visit: Admitting: Family
